# Patient Record
Sex: Male | Born: 1985 | Race: Black or African American | Hispanic: No | Marital: Single | Smoking: Current some day smoker
Health system: Southern US, Community
[De-identification: ages and names within clinical notes are randomized; demographics above are authoritative.]

## PROBLEM LIST (undated history)

## (undated) ENCOUNTER — Emergency Department (HOSPITAL_COMMUNITY): Discharge status: Against Medical Advice | Payer: Self-pay

## (undated) DIAGNOSIS — S62211A Bennett's fracture, right hand, initial encounter for closed fracture: Secondary | ICD-10-CM

## (undated) DIAGNOSIS — F209 Schizophrenia, unspecified: Secondary | ICD-10-CM

## (undated) HISTORY — PX: NO PAST SURGERIES: SHX2092

---

## 2008-01-02 ENCOUNTER — Emergency Department (HOSPITAL_COMMUNITY): Admission: EM | Admit: 2008-01-02 | Discharge: 2008-01-02 | Payer: Self-pay | Admitting: Emergency Medicine

## 2008-02-16 ENCOUNTER — Emergency Department (HOSPITAL_COMMUNITY): Admission: EM | Admit: 2008-02-16 | Discharge: 2008-02-16 | Payer: Self-pay | Admitting: Family Medicine

## 2010-01-22 ENCOUNTER — Ambulatory Visit: Payer: Self-pay | Admitting: Nurse Practitioner

## 2010-01-22 DIAGNOSIS — H0469 Other changes of lacrimal passages: Secondary | ICD-10-CM | POA: Insufficient documentation

## 2010-01-22 DIAGNOSIS — K029 Dental caries, unspecified: Secondary | ICD-10-CM | POA: Insufficient documentation

## 2010-01-22 DIAGNOSIS — F17200 Nicotine dependence, unspecified, uncomplicated: Secondary | ICD-10-CM | POA: Insufficient documentation

## 2010-01-22 LAB — CONVERTED CEMR LAB
AST: 16 units/L (ref 0–37)
Albumin: 4.4 g/dL (ref 3.5–5.2)
Alkaline Phosphatase: 44 units/L (ref 39–117)
Basophils Absolute: 0 10*3/uL (ref 0.0–0.1)
Basophils Relative: 1 % (ref 0–1)
Eosinophils Absolute: 0.1 10*3/uL (ref 0.0–0.7)
Eosinophils Relative: 1 % (ref 0–5)
Glucose, Bld: 89 mg/dL (ref 70–99)
HCT: 42.9 % (ref 39.0–52.0)
HDL: 65 mg/dL (ref >39)
LDL Cholesterol: 68 mg/dL (ref 0–99)
Lymphs Abs: 1.9 10*3/uL (ref 0.7–4.0)
MCHC: 32.6 g/dL (ref 30.0–36.0)
MCV: 95.8 fL (ref 78.0–100.0)
Neutrophils Relative %: 34 % — ABNORMAL LOW (ref 43–77)
Platelets: 272 10*3/uL (ref 150–400)
Potassium: 4.5 meq/L (ref 3.5–5.3)
RDW: 13.3 % (ref 11.5–15.5)
Sodium: 137 meq/L (ref 135–145)
TSH: 1.305 microintl units/mL (ref 0.350–4.500)
Total Bilirubin: 0.7 mg/dL (ref 0.3–1.2)
Total Protein: 7.3 g/dL (ref 6.0–8.3)
Triglycerides: 51 mg/dL (ref <150)
VLDL: 10 mg/dL (ref 0–40)
WBC: 4 10*3/uL (ref 4.0–10.5)

## 2010-01-23 ENCOUNTER — Encounter (INDEPENDENT_AMBULATORY_CARE_PROVIDER_SITE_OTHER): Payer: Self-pay | Admitting: Nurse Practitioner

## 2010-01-27 ENCOUNTER — Emergency Department (HOSPITAL_COMMUNITY): Admission: EM | Admit: 2010-01-27 | Discharge: 2010-01-27 | Payer: Self-pay | Admitting: Emergency Medicine

## 2010-02-05 ENCOUNTER — Encounter (INDEPENDENT_AMBULATORY_CARE_PROVIDER_SITE_OTHER): Payer: Self-pay | Admitting: Nurse Practitioner

## 2010-05-15 NOTE — Letter (Signed)
Summary: DENTAL REFERRAL  DENTAL REFERRAL   Imported By: Arta Bruce 02/09/2010 11:39:58  _____________________________________________________________________  External Attachment:    Type:   Image     Comment:   External Document

## 2010-05-15 NOTE — Assessment & Plan Note (Signed)
Summary: NEW - Establish Care   Vital Signs:  Patient profile:   25 year old male Height:      67 inches Weight:      149.7 pounds BMI:     23.53 Temp:     97.1 degrees F oral Pulse rate:   84 / minute Pulse rhythm:   regular Resp:     20 per minute BP sitting:   110 / 70  (left arm) Cuff size:   regular  Vitals Entered By: Levon Hedger (January 22, 2010 11:39 AM) 06/235CC: new establish...needs CPE...dental needs...facial concerns around eye Is Patient Diabetic? No Pain Assessment Patient in pain? no       Does patient need assistance? Functional Status Self care Ambulation Normal   CC:  new establish...needs CPE...dental needs...facial concerns around eye.  History of Present Illness:  Pt into the office to establish care Incarcerated for the past 6 years with recent release. No previous PCP  No PMH NO PSH    Habits & Providers  Alcohol-Tobacco-Diet     Alcohol drinks/day: <1     Alcohol Counseling: to decrease amount and/or frequency of alcohol intake     Tobacco Status: current     Tobacco Counseling: to quit use of tobacco products     Cigarette Packs/Day: <0.25     Year Started: 12  Exercise-Depression-Behavior     Does Patient Exercise: no     Exercise Counseling: to improve exercise regimen     Have you felt down or hopeless? no     Have you felt little pleasure in things? no     Depression Counseling: not indicated; screening negative for depression     Drug Use: marijuanna  Medications Prior to Update: 1)  None  Allergies (verified): No Known Drug Allergies  Family History: mother - healthy Father - noncontributory  Social History: incarcerated for 6 years, out in 2011 tobacco - Drug -  ETOH - Smoking Status:  current Drug Use:  marijuanna Packs/Day:  <0.25 Does Patient Exercise:  no  Review of Systems Eyes:  Complains of light sensitivity; denies red eye; left eye - intermittent swelling. no pain, no redness, no  itching. CV:  Denies chest pain or discomfort. Resp:  Denies cough. GI:  Denies abdominal pain, nausea, and vomiting.  Physical Exam  General:  alert.   Head:  normocephalic.   Eyes:  left upper lid - mobile, circumscribed nontender nodule left lower lid - mobile, circumscribed, nontender nodue x 2 Mouth:  fair dentition.   Lungs:  normal breath sounds.   Heart:  normal rate and regular rhythm.   Abdomen:  normal bowel sounds.   Msk:  normal ROM.   Neurologic:  alert & oriented X3.     Impression & Recommendations:  Problem # 1:  DENTAL CARIES (ICD-521.00) will refer to dental clinic  advised pt that dental clinic will not address cosmetic needs Orders: Dental Referral (Dentist)  Problem # 2:  OTHER CHANGE OF LACRIMAL PASSAGES (ICD-375.69) advised pt to apply warm compresses to area blocked tear ducts  Problem # 3:  TOBACCO ABUSE (ICD-305.1) advised cessation  Problem # 4:  HEALTH MAINTENANCE EXAM (ICD-V70.0) Declines flu vaccine today Orders: T-Comprehensive Metabolic Panel (40981-19147) T-Lipid Profile (82956-21308) T-CBC w/Diff (65784-69629) Rapid HIV  (52841) T-TSH (32440-10272)  Patient Instructions: 1)  Schedule an appointment for a complete physical exam. 2)  Will review labs. 3)  Will need u/a, testicular exam, tdap. 4)  Left eye - likely blocked  ducts in your eye. You may notice an increase in sensitivity in your eye meaning that it may be more sensitive to light and tear easily. 5)  Apply warm compresses to the left eye. 6)  You have declined the flu vaccine today.  just know that it available in this office and if you change your mind let us know. 7)  You will be referred to the dental clinic  Prevention & Chronic Care Immunizations   Influenza vaccine: Not documented   Influenza vaccine deferral: Refused  (01/22/2010)    Tetanus booster: Not documented    Pneumococcal vaccine: Not documented  Other Screening   Smoking status: current   (01/22/2010)   Appended Document: NEW - Establish Care  Laboratory Results    Other Tests  Rapid HIV: negative

## 2010-05-15 NOTE — Letter (Signed)
Summary: *HSN Results Follow up  [facility]  [address] [place], [place] [address]   Phone: [phone]  Fax: [phone]      01/23/2010   [facility] [address] [place], [place]  [address]   Dear  [name],                            ____S.Drinkard,FNP   ____D. Gore,FNP       ____B. McPherson,MD   ____V. Rankins,MD    ____E. Mulberry,MD    _X___N. Daphine Deutscher, FNP  ____D. Reche Dixon, MD    ____K. Philipp Deputy, MD    ____Other     This letter is to inform you that your recent test(s):  _______Pap Smear    ___X____Lab Test     _______X-ray    ___X____ is within acceptable limits  _______ requires a medication change  _______ requires a follow-up lab visit  _______ requires a follow-up visit with your provider   Comments: Labs done during recent office visit are normal.       _________________________________________________________ If you have any questions, please contact our office [phone].                    Sincerely,    Lehman Prom FNP [facility]

## 2010-05-15 NOTE — Letter (Signed)
Summary: PT INFORMATION SHEET  PT INFORMATION SHEET   Imported By: Arta Bruce 01/22/2010 14:28:06  _____________________________________________________________________  External Attachment:    Type:   Image     Comment:   External Document

## 2010-05-16 ENCOUNTER — Ambulatory Visit: Admit: 2010-05-16 | Payer: Self-pay | Admitting: Nurse Practitioner

## 2011-01-14 LAB — POCT I-STAT, CHEM 8
Creatinine, Ser: 1
Glucose, Bld: 89
HCT: 43
Hemoglobin: 14.6
Sodium: 141
TCO2: 21

## 2012-08-19 ENCOUNTER — Emergency Department (HOSPITAL_COMMUNITY): Payer: Self-pay

## 2012-08-19 ENCOUNTER — Emergency Department (HOSPITAL_COMMUNITY)
Admission: EM | Admit: 2012-08-19 | Discharge: 2012-08-19 | Payer: Self-pay | Attending: Emergency Medicine | Admitting: Emergency Medicine

## 2012-08-19 ENCOUNTER — Encounter (HOSPITAL_COMMUNITY): Payer: Self-pay | Admitting: Emergency Medicine

## 2012-08-19 DIAGNOSIS — Y939 Activity, unspecified: Secondary | ICD-10-CM | POA: Insufficient documentation

## 2012-08-19 DIAGNOSIS — S0181XA Laceration without foreign body of other part of head, initial encounter: Secondary | ICD-10-CM

## 2012-08-19 DIAGNOSIS — S0180XA Unspecified open wound of other part of head, initial encounter: Secondary | ICD-10-CM

## 2012-08-19 DIAGNOSIS — S0183XA Puncture wound without foreign body of other part of head, initial encounter: Secondary | ICD-10-CM

## 2012-08-19 DIAGNOSIS — W3400XA Accidental discharge from unspecified firearms or gun, initial encounter: Secondary | ICD-10-CM | POA: Insufficient documentation

## 2012-08-19 DIAGNOSIS — Y92410 Unspecified street and highway as the place of occurrence of the external cause: Secondary | ICD-10-CM | POA: Insufficient documentation

## 2012-08-19 LAB — COMPREHENSIVE METABOLIC PANEL
ALT: 51 U/L (ref 0–53)
AST: 82 U/L — ABNORMAL HIGH (ref 0–37)
Alkaline Phosphatase: 63 U/L (ref 39–117)
CO2: 20 mEq/L (ref 19–32)
Calcium: 9.2 mg/dL (ref 8.4–10.5)
GFR calc Af Amer: >90 mL/min (ref >90)
Glucose, Bld: 70 mg/dL (ref 70–99)
Potassium: 3.5 mEq/L (ref 3.5–5.1)
Sodium: 137 mEq/L (ref 135–145)
Total Protein: 8.5 g/dL — ABNORMAL HIGH (ref 6.0–8.3)

## 2012-08-19 LAB — CBC
HCT: 38 % — ABNORMAL LOW (ref 39.0–52.0)
Hemoglobin: 13.6 g/dL (ref 13.0–17.0)
MCH: 32.5 pg (ref 26.0–34.0)
MCHC: 35.8 g/dL (ref 30.0–36.0)
RDW: 12.3 % (ref 11.5–15.5)

## 2012-08-19 LAB — POCT I-STAT, CHEM 8
Chloride: 105 mEq/L (ref 96–112)
Creatinine, Ser: 1.3 mg/dL (ref 0.50–1.35)
Glucose, Bld: 74 mg/dL (ref 70–99)
HCT: 43 % (ref 39.0–52.0)
Hemoglobin: 14.6 g/dL (ref 13.0–17.0)
Potassium: 3.6 mEq/L (ref 3.5–5.1)
Sodium: 139 mEq/L (ref 135–145)

## 2012-08-19 LAB — CDS SEROLOGY

## 2012-08-19 MED ORDER — CHLORHEXIDINE GLUCONATE 0.12 % MT SOLN: 15.0000 mL | Freq: Two times a day (BID) | OROMUCOSAL | Status: DC

## 2012-08-19 MED ORDER — CEFAZOLIN SODIUM 1-5 GM-% IV SOLN: 1.0000 g | Freq: Once | INTRAVENOUS | Status: DC

## 2012-08-19 MED ORDER — HYDROMORPHONE HCL PF 1 MG/ML IJ SOLN
1.0000 mg | Freq: Once | INTRAMUSCULAR | Status: AC
  Administered 2012-08-19: 1 mg via INTRAVENOUS
  Filled 2012-08-19: qty 1

## 2012-08-19 MED ORDER — AMOXICILLIN-POT CLAVULANATE 875-125 MG PO TABS: 1.0000 | ORAL_TABLET | Freq: Two times a day (BID) | ORAL | Status: DC

## 2012-08-19 MED ORDER — IBUPROFEN 600 MG PO TABS: 600.0000 mg | ORAL_TABLET | Freq: Four times a day (QID) | ORAL | Status: DC | PRN

## 2012-08-19 MED ORDER — CEFAZOLIN SODIUM-DEXTROSE 2-3 GM-% IV SOLR
2.0000 g | Freq: Once | INTRAVENOUS | Status: AC
  Administered 2012-08-19: 2 g via INTRAVENOUS
  Filled 2012-08-19: qty 50

## 2012-08-19 MED ORDER — FENTANYL CITRATE 0.05 MG/ML IJ SOLN
INTRAMUSCULAR | Status: AC
  Filled 2012-08-19: qty 2

## 2012-08-19 MED ORDER — LIDOCAINE-EPINEPHRINE 2 %-1:100000 IJ SOLN
20.0000 mL | Freq: Once | INTRAMUSCULAR | Status: DC
  Filled 2012-08-19: qty 20

## 2012-08-19 MED ORDER — TETANUS-DIPHTH-ACELL PERTUSSIS 5-2.5-18.5 LF-MCG/0.5 IM SUSP
0.5000 mL | Freq: Once | INTRAMUSCULAR | Status: AC
  Administered 2012-08-19: 0.5 mL via INTRAMUSCULAR
  Filled 2012-08-19: qty 0.5

## 2012-08-19 MED ORDER — FENTANYL CITRATE 0.05 MG/ML IJ SOLN
50.0000 ug | INTRAMUSCULAR | Status: DC | PRN
  Administered 2012-08-19: 100 ug via INTRAVENOUS
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
## 2012-08-19 NOTE — ED Notes (Signed)
Patient transported to CT 
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
## 2012-08-19 NOTE — ED Notes (Signed)
BIB [facility].

## 2012-08-20 ENCOUNTER — Encounter (HOSPITAL_COMMUNITY): Payer: Self-pay | Admitting: Emergency Medicine

## 2012-08-20 LAB — TYPE AND SCREEN: ABO/RH(D): B NEG
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
## 2014-09-16 DIAGNOSIS — S62211A Bennett's fracture, right hand, initial encounter for closed fracture: Secondary | ICD-10-CM

## 2014-09-16 HISTORY — DX: Bennett's fracture, right hand, initial encounter for closed fracture: S62.211A

## 2014-09-20 ENCOUNTER — Encounter (HOSPITAL_COMMUNITY): Payer: Self-pay | Admitting: Emergency Medicine

## 2014-09-20 ENCOUNTER — Emergency Department (HOSPITAL_COMMUNITY)
Admission: EM | Admit: 2014-09-20 | Discharge: 2014-09-20 | Disposition: A | Discharge status: Home / Self Care | Payer: Self-pay | Attending: Emergency Medicine | Admitting: Emergency Medicine

## 2014-09-20 ENCOUNTER — Emergency Department (HOSPITAL_COMMUNITY): Payer: Self-pay

## 2014-09-20 DIAGNOSIS — Y998 Other external cause status: Secondary | ICD-10-CM | POA: Insufficient documentation

## 2014-09-20 DIAGNOSIS — W1839XA Other fall on same level, initial encounter: Secondary | ICD-10-CM | POA: Insufficient documentation

## 2014-09-20 DIAGNOSIS — Y9361 Activity, american tackle football: Secondary | ICD-10-CM | POA: Insufficient documentation

## 2014-09-20 DIAGNOSIS — S62231A Other displaced fracture of base of first metacarpal bone, right hand, initial encounter for closed fracture: Secondary | ICD-10-CM | POA: Insufficient documentation

## 2014-09-20 DIAGNOSIS — Y92321 Football field as the place of occurrence of the external cause: Secondary | ICD-10-CM | POA: Insufficient documentation

## 2014-09-20 DIAGNOSIS — S62309A Unspecified fracture of unspecified metacarpal bone, initial encounter for closed fracture: Secondary | ICD-10-CM

## 2014-09-20 NOTE — Discharge Instructions (Signed)

## 2014-09-20 NOTE — ED Provider Notes (Signed)
CSN: [id number]     Arrival date & time 09/20/14  1258 History  This chart was scribed for a non-physician practitioner, Eyvonne Mechanic, PA-C working with Rolland Porter, MD by [place] Peace, ED Scribe. The patient was seen in [facility]. The patient's care was started at 2:21 PM.     Chief Complaint  Patient presents with  . Hand Pain      Patient is a 29 y.o. male presenting with hand pain. The history is provided by the patient. No language interpreter was used.  Hand Pain  HPI Comments: Mike Thompson is a 29 y.o. male who presents to the Emergency Department complaining of progressively worsening radiating right hand pain onset Friday that extends up into his elbow. incident occurred while he was playing football and fell on affected hand. Unknown mechanism of injury, he states he can't remember how exactly how landed on injured hand. He notes that he has tried wrapping affected hand and taking ibuprofen to address pain without relief. Pt is right-hand dominant. Last food intake was a banana around 10:00 AM. Pt is current drinker (last drink- 2 days ago). Pt is non-smoker. He states he is not currently employed. No history of trauma to the hand previously.   History reviewed. No pertinent past medical history. History reviewed. No pertinent past surgical history. History reviewed. No pertinent family history. History  Substance Use Topics  . Smoking status: Never Smoker   . Smokeless tobacco: Not on file  . Alcohol Use: No    Review of Systems  Musculoskeletal: Positive for arthralgias.       Right hand pain and right elbow pain.   All other systems reviewed and are negative.   Allergies  Review of patient's allergies indicates no known allergies.  Home Medications   Prior to Admission medications   Medication Sig Start Date End Date Taking? Authorizing Provider  amoxicillin-clavulanate (AUGMENTIN) 875-125 MG per tablet Take 1 tablet by mouth 2 (two) times daily. One po bid x  7 days 08/19/12   Oleh Genin, MD  chlorhexidine (PERIDEX) 0.12 % solution Use as directed 15 mLs in the mouth or throat 2 (two) times daily. Swish and spit. 08/19/12   Oleh Genin, MD  ibuprofen (ADVIL,MOTRIN) 600 MG tablet Take 1 tablet (600 mg total) by mouth every 6 (six) hours as needed for pain. 08/19/12   Oleh Genin, MD   Pulse 66  Temp(Src) 98.1 F (36.7 C) (Oral)  Resp 18  SpO2 100%  Physical Exam  Constitutional: He is oriented to person, place, and time. He appears well-developed and well-nourished. No distress.  HENT:  Head: Normocephalic and atraumatic.  Eyes: Conjunctivae and EOM are normal.  Neck: Neck supple. No tracheal deviation present.  Cardiovascular: Normal rate.   Pulmonary/Chest: Effort normal. No respiratory distress.  Musculoskeletal: He exhibits tenderness.  ROM of 2-4 intact. Decreased ROM of thumb. Pain with flexion, extension, and ulnar deviation.   Neurological: He is alert and oriented to person, place, and time.  Skin: Skin is warm and dry.  Psychiatric: He has a normal mood and affect. His behavior is normal.  Nursing note and vitals reviewed.   ED Course  Procedures (including critical care time) Labs Review Labs Reviewed - No data to display  Imaging Review Dg Hand Complete Right  09/20/2014   CLINICAL DATA:  Hand pain. Football injury on Friday. Pain at the base of the thumb.  EXAM: RIGHT HAND - COMPLETE 3+ VIEW  COMPARISON:  None.  FINDINGS: There  is lateral subluxation of the thumb on the trapezium. There is a fracture at the ulnar base of the first metacarpal with intra-articular extension. There appears to be a single fragment from the base of the metacarpal, most compatible with a Bennett fracture. Otherwise the hand appears within normal limits.  IMPRESSION: RIGHT first metacarpal base intra-articular Bennett fracture.   Electronically Signed   By: Andreas NewportGeoffrey  Lamke M.D.   On: 09/20/2014 14:01     EKG Interpretation None     Medications -  No data to display  2:24 PM- Treatment plan was discussed with patient who verbalizes understanding and agrees.   MDM   Final diagnoses:  Metacarpal bone fracture, closed, initial encounter      Imaging: DG hand complete right first metacarpal base intra-articular Bennett fracture subluxation   Consults: hand surgery   Therapeutics:  None  Assessment: Metacarpal fracture  Plan: Patient presents with a metacarpal flexure and subluxation he was in minimal pain, no concerning findings including sensory or strength deficits, was placed into a splint, and given follow-up information for hand surgeon. Patient verbalizes understanding and agreement for today's plan and had no further questions or concerns at time of discharge. He reports that he would follow-up. Patient was given strict return precautions.    I personally performed the services described in this documentation, which was scribed in my presence. The recorded information has been reviewed and is accurate.   Eyvonne MechanicJeffrey Yalonda Sample, PA-C 09/21/14 [id number]  Rolland PorterMark James, MD 09/30/14 [phone]

## 2014-09-20 NOTE — ED Notes (Signed)
Pt states that he was playing football on Friday and injured his right thumb/hand.  States that it feels like "a bone is popping out".

## 2014-09-23 ENCOUNTER — Encounter (HOSPITAL_BASED_OUTPATIENT_CLINIC_OR_DEPARTMENT_OTHER): Payer: Self-pay | Admitting: *Deleted

## 2014-09-23 ENCOUNTER — Other Ambulatory Visit: Payer: Self-pay | Admitting: Orthopedic Surgery

## 2014-09-26 ENCOUNTER — Ambulatory Visit (HOSPITAL_BASED_OUTPATIENT_CLINIC_OR_DEPARTMENT_OTHER): Payer: Self-pay | Admitting: Certified Registered"

## 2014-09-26 ENCOUNTER — Ambulatory Visit (HOSPITAL_BASED_OUTPATIENT_CLINIC_OR_DEPARTMENT_OTHER)
Admission: RE | Admit: 2014-09-26 | Discharge: 2014-09-26 | Disposition: A | Discharge status: Home / Self Care | Payer: Self-pay | Source: Ambulatory Visit | Attending: Orthopedic Surgery | Admitting: Orthopedic Surgery

## 2014-09-26 ENCOUNTER — Encounter (HOSPITAL_BASED_OUTPATIENT_CLINIC_OR_DEPARTMENT_OTHER)
Admission: RE | Disposition: A | Discharge status: Home / Self Care | Payer: Self-pay | Source: Ambulatory Visit | Attending: Orthopedic Surgery

## 2014-09-26 ENCOUNTER — Encounter (HOSPITAL_BASED_OUTPATIENT_CLINIC_OR_DEPARTMENT_OTHER): Payer: Self-pay | Admitting: Anesthesiology

## 2014-09-26 DIAGNOSIS — Y929 Unspecified place or not applicable: Secondary | ICD-10-CM | POA: Insufficient documentation

## 2014-09-26 DIAGNOSIS — X58XXXA Exposure to other specified factors, initial encounter: Secondary | ICD-10-CM | POA: Insufficient documentation

## 2014-09-26 DIAGNOSIS — S62211A Bennett's fracture, right hand, initial encounter for closed fracture: Secondary | ICD-10-CM | POA: Insufficient documentation

## 2014-09-26 DIAGNOSIS — Y9361 Activity, american tackle football: Secondary | ICD-10-CM | POA: Insufficient documentation

## 2014-09-26 DIAGNOSIS — F17210 Nicotine dependence, cigarettes, uncomplicated: Secondary | ICD-10-CM | POA: Insufficient documentation

## 2014-09-26 DIAGNOSIS — Z791 Long term (current) use of non-steroidal anti-inflammatories (NSAID): Secondary | ICD-10-CM | POA: Insufficient documentation

## 2014-09-26 DIAGNOSIS — Y999 Unspecified external cause status: Secondary | ICD-10-CM | POA: Insufficient documentation

## 2014-09-26 HISTORY — PX: CLOSED REDUCTION FINGER WITH PERCUTANEOUS PINNING: SHX5612

## 2014-09-26 HISTORY — DX: Bennett's fracture, right hand, initial encounter for closed fracture: S62.211A

## 2014-09-26 LAB — POCT HEMOGLOBIN-HEMACUE: HEMOGLOBIN: 14.4 g/dL (ref 13.0–17.0)

## 2014-09-26 SURGERY — CLOSED REDUCTION, FINGER, WITH PERCUTANEOUS PINNING
Anesthesia: Regional | Site: Thumb | Laterality: Right

## 2014-09-26 MED ORDER — CEFAZOLIN SODIUM-DEXTROSE 2-3 GM-% IV SOLR
2.0000 g | INTRAVENOUS | Status: AC
  Administered 2014-09-26: 2 g via INTRAVENOUS

## 2014-09-26 MED ORDER — CEFAZOLIN SODIUM-DEXTROSE 2-3 GM-% IV SOLR
INTRAVENOUS | Status: AC
  Filled 2014-09-26: qty 50

## 2014-09-26 MED ORDER — DEXAMETHASONE SODIUM PHOSPHATE 4 MG/ML IJ SOLN
INTRAMUSCULAR | Status: DC | PRN
  Administered 2014-09-26: 10 mg via INTRAVENOUS

## 2014-09-26 MED ORDER — SCOPOLAMINE 1 MG/3DAYS TD PT72: 1.0000 | MEDICATED_PATCH | Freq: Once | TRANSDERMAL | Status: DC | PRN

## 2014-09-26 MED ORDER — LIDOCAINE-EPINEPHRINE (PF) 1.5 %-1:200000 IJ SOLN
INTRAMUSCULAR | Status: DC | PRN
  Administered 2014-09-26: 25 mL via PERINEURAL

## 2014-09-26 MED ORDER — MIDAZOLAM HCL 2 MG/2ML IJ SOLN
1.0000 mg | INTRAMUSCULAR | Status: DC | PRN
  Administered 2014-09-26: 1 mg via INTRAVENOUS

## 2014-09-26 MED ORDER — FENTANYL CITRATE (PF) 100 MCG/2ML IJ SOLN
INTRAMUSCULAR | Status: AC
  Filled 2014-09-26: qty 6

## 2014-09-26 MED ORDER — MIDAZOLAM HCL 2 MG/2ML IJ SOLN
INTRAMUSCULAR | Status: AC
  Filled 2014-09-26: qty 2

## 2014-09-26 MED ORDER — ACETAMINOPHEN 500 MG PO TABS: 1000.0000 mg | ORAL_TABLET | Freq: Once | ORAL | Status: DC

## 2014-09-26 MED ORDER — LACTATED RINGERS IV SOLN: 500.0000 mL | INTRAVENOUS | Status: DC

## 2014-09-26 MED ORDER — MEPERIDINE HCL 25 MG/ML IJ SOLN: 6.2500 mg | INTRAMUSCULAR | Status: DC | PRN

## 2014-09-26 MED ORDER — PROPOFOL INFUSION 10 MG/ML OPTIME
INTRAVENOUS | Status: DC | PRN
  Administered 2014-09-26: 100 ug/kg/min via INTRAVENOUS

## 2014-09-26 MED ORDER — HYDROMORPHONE HCL 1 MG/ML IJ SOLN: 0.2500 mg | INTRAMUSCULAR | Status: DC | PRN

## 2014-09-26 MED ORDER — BUPIVACAINE-EPINEPHRINE (PF) 0.5% -1:200000 IJ SOLN
INTRAMUSCULAR | Status: DC | PRN
  Administered 2014-09-26: 30 mL via PERINEURAL

## 2014-09-26 MED ORDER — FENTANYL CITRATE (PF) 100 MCG/2ML IJ SOLN
INTRAMUSCULAR | Status: AC
  Filled 2014-09-26: qty 2

## 2014-09-26 MED ORDER — CHLORHEXIDINE GLUCONATE 4 % EX LIQD: 60.0000 mL | Freq: Once | CUTANEOUS | Status: DC

## 2014-09-26 MED ORDER — LACTATED RINGERS IV SOLN
INTRAVENOUS | Status: DC
  Administered 2014-09-26: 12:00:00 via INTRAVENOUS

## 2014-09-26 MED ORDER — ACETAMINOPHEN 160 MG/5ML PO SOLN: 960.0000 mg | Freq: Once | ORAL | Status: DC

## 2014-09-26 MED ORDER — OXYCODONE-ACETAMINOPHEN 5-325 MG PO TABS: ORAL_TABLET | ORAL | Status: DC

## 2014-09-26 MED ORDER — ONDANSETRON HCL 4 MG/2ML IJ SOLN
INTRAMUSCULAR | Status: DC | PRN
  Administered 2014-09-26: 4 mg via INTRAVENOUS

## 2014-09-26 MED ORDER — PROMETHAZINE HCL 25 MG/ML IJ SOLN: 6.2500 mg | INTRAMUSCULAR | Status: DC | PRN

## 2014-09-26 MED ORDER — MIDAZOLAM HCL 2 MG/2ML IJ SOLN: 0.5000 mg | Freq: Once | INTRAMUSCULAR | Status: DC | PRN

## 2014-09-26 MED ORDER — FENTANYL CITRATE (PF) 100 MCG/2ML IJ SOLN
INTRAMUSCULAR | Status: DC | PRN
  Administered 2014-09-26: 50 ug via INTRAVENOUS

## 2014-09-26 MED ORDER — LIDOCAINE HCL (CARDIAC) 20 MG/ML IV SOLN
INTRAVENOUS | Status: DC | PRN
  Administered 2014-09-26: 50 mg via INTRAVENOUS

## 2014-09-26 MED ORDER — FENTANYL CITRATE (PF) 100 MCG/2ML IJ SOLN
50.0000 ug | Freq: Once | INTRAMUSCULAR | Status: AC
  Administered 2014-09-26: 50 ug via INTRAVENOUS

## 2014-09-26 MED ORDER — BUPIVACAINE HCL (PF) 0.25 % IJ SOLN
INTRAMUSCULAR | Status: AC
Start: 2014-09-26 — End: 2014-09-26
  Filled 2014-09-26: qty 30

## 2014-09-26 SURGICAL SUPPLY — 48 items
BANDAGE ELASTIC 3 VELCRO ST LF (GAUZE/BANDAGES/DRESSINGS) ×3 IMPLANT
BLADE SURG 15 STRL LF DISP TIS (BLADE) ×2 IMPLANT
BLADE SURG 15 STRL SS (BLADE) ×4
BNDG ELASTIC 2 VLCR STRL LF (GAUZE/BANDAGES/DRESSINGS) IMPLANT
BNDG ESMARK 4X9 LF (GAUZE/BANDAGES/DRESSINGS) ×3 IMPLANT
BNDG GAUZE ELAST 4 BULKY (GAUZE/BANDAGES/DRESSINGS) ×3 IMPLANT
CHLORAPREP W/TINT 26ML (MISCELLANEOUS) ×3 IMPLANT
CORDS BIPOLAR (ELECTRODE) IMPLANT
COVER BACK TABLE 60X90IN (DRAPES) ×3 IMPLANT
COVER MAYO STAND STRL (DRAPES) ×3 IMPLANT
CUFF TOURNIQUET SINGLE 18IN (TOURNIQUET CUFF) ×3 IMPLANT
DRAPE EXTREMITY T 121X128X90 (DRAPE) ×3 IMPLANT
DRAPE OEC MINIVIEW 54X84 (DRAPES) ×3 IMPLANT
DRAPE SURG 17X23 STRL (DRAPES) ×3 IMPLANT
GAUZE SPONGE 4X4 12PLY STRL (GAUZE/BANDAGES/DRESSINGS) ×3 IMPLANT
GAUZE XEROFORM 1X8 LF (GAUZE/BANDAGES/DRESSINGS) ×3 IMPLANT
GLOVE BIO SURGEON STRL SZ7.5 (GLOVE) ×3 IMPLANT
GLOVE BIOGEL PI IND STRL 7.0 (GLOVE) ×1 IMPLANT
GLOVE BIOGEL PI IND STRL 8 (GLOVE) ×1 IMPLANT
GLOVE BIOGEL PI IND STRL 8.5 (GLOVE) IMPLANT
GLOVE BIOGEL PI INDICATOR 7.0 (GLOVE) ×2
GLOVE BIOGEL PI INDICATOR 8 (GLOVE) ×2
GLOVE BIOGEL PI INDICATOR 8.5 (GLOVE)
GLOVE ECLIPSE 6.5 STRL STRAW (GLOVE) ×3 IMPLANT
GLOVE EXAM NITRILE EXT CUFF MD (GLOVE) ×3 IMPLANT
GLOVE SURG ORTHO 8.0 STRL STRW (GLOVE) IMPLANT
GOWN STRL REUS W/ TWL LRG LVL3 (GOWN DISPOSABLE) ×1 IMPLANT
GOWN STRL REUS W/TWL LRG LVL3 (GOWN DISPOSABLE) ×2
K-WIRE .045X4 (WIRE) ×3 IMPLANT
K-WIRE .062X4 (WIRE) ×3 IMPLANT
NEEDLE HYPO 25X1 1.5 SAFETY (NEEDLE) IMPLANT
NS IRRIG 1000ML POUR BTL (IV SOLUTION) IMPLANT
PACK BASIN DAY SURGERY FS (CUSTOM PROCEDURE TRAY) ×3 IMPLANT
PAD CAST 3X4 CTTN HI CHSV (CAST SUPPLIES) ×1 IMPLANT
PAD CAST 4YDX4 CTTN HI CHSV (CAST SUPPLIES) IMPLANT
PADDING CAST COTTON 3X4 STRL (CAST SUPPLIES) ×2
PADDING CAST COTTON 4X4 STRL (CAST SUPPLIES)
SLEEVE SCD COMPRESS KNEE MED (MISCELLANEOUS) IMPLANT
SLING ARM LRG ADULT FOAM STRAP (SOFTGOODS) ×3 IMPLANT
SPLINT PLASTER CAST XFAST 3X15 (CAST SUPPLIES) ×20 IMPLANT
SPLINT PLASTER XTRA FASTSET 3X (CAST SUPPLIES) ×40
STOCKINETTE 4X48 STRL (DRAPES) ×3 IMPLANT
SUT ETHILON 3 0 PS 1 (SUTURE) IMPLANT
SUT ETHILON 4 0 PS 2 18 (SUTURE) IMPLANT
SYR BULB 3OZ (MISCELLANEOUS) IMPLANT
SYR CONTROL 10ML LL (SYRINGE) IMPLANT
TOWEL OR 17X24 6PK STRL BLUE (TOWEL DISPOSABLE) ×6 IMPLANT
UNDERPAD 30X30 (UNDERPADS AND DIAPERS) ×3 IMPLANT

## 2014-09-26 NOTE — Discharge Instructions (Addendum)

## 2014-09-26 NOTE — Op Note (Signed)
Intra-operative fluoroscopic images in the AP, lateral, and oblique views were taken and evaluated by myself.  Reduction and hardware placement were confirmed.  There was no intraarticular penetration of permanent hardware.  

## 2014-09-26 NOTE — Progress Notes (Signed)
Assisted Dr. Jackson with right, axillary block. Side rails up, monitors on throughout procedure. See vital signs in flow sheet. Tolerated Procedure well. 

## 2014-09-26 NOTE — Op Note (Signed)
[id number] 

## 2014-09-26 NOTE — Brief Op Note (Signed)
09/26/2014  2:39 PM  PATIENT:  Mike Thompson  29 y.o. male  PRE-OPERATIVE DIAGNOSIS:  right thumb Bennett's Fracture  S62.211A  POST-OPERATIVE DIAGNOSIS:  right thumb Bennett's Fracture  S62.211A  PROCEDURE:  Procedure(s) with comments: CLOSED REDUCTION FINGER WITH PERCUTANEOUS PINNING RIGHT THUMB BENNETT'S FRACTURE (Right) - block with sedation  SURGEON:  Surgeon(s) and Role:    * Betha Loa, MD - Primary  PHYSICIAN ASSISTANT:   ASSISTANTS: none   ANESTHESIA:   regional and sedation  EBL:  Total I/O In: 700 [I.V.:700] Out: -   BLOOD ADMINISTERED:none  DRAINS: none   LOCAL MEDICATIONS USED:  NONE  SPECIMEN:  No Specimen  DISPOSITION OF SPECIMEN:  N/A  COUNTS:  YES  TOURNIQUET:   Total Tourniquet Time Documented: Upper Arm (Right) - 16 minutes Total: Upper Arm (Right) - 16 minutes   DICTATION: .Other Dictation: Dictation Number [id number]  PLAN OF CARE: Discharge to home after PACU  PATIENT DISPOSITION:  PACU - hemodynamically stable.

## 2014-09-26 NOTE — Transfer of Care (Signed)
Immediate Anesthesia Transfer of Care Note  Patient: Mike Thompson  Procedure(s) Performed: Procedure(s) with comments: CLOSED REDUCTION FINGER WITH PERCUTANEOUS PINNING RIGHT THUMB BENNETT'S FRACTURE (Right) - block with sedation  Patient Location: PACU  Anesthesia Type:MAC and Regional  Level of Consciousness: awake and alert   Airway & Oxygen Therapy: Patient Spontanous Breathing and Patient connected to face mask oxygen  Post-op Assessment: Report given to RN and Post -op Vital signs reviewed and stable  Post vital signs: Reviewed and stable  Last Vitals:  Filed Vitals:   09/26/14 1250  BP:   Pulse: 79  Temp:   Resp: 24    Complications: No apparent anesthesia complications

## 2014-09-26 NOTE — Anesthesia Procedure Notes (Signed)
Anesthesia Regional Block:  Axillary brachial plexus block  Pre-Anesthetic Checklist: ,, timeout performed, Correct Patient, Correct Site, Correct Laterality, Correct Procedure, Correct Position, site marked, Risks and benefits discussed,  Surgical consent,  Pre-op evaluation,  At surgeon's request and post-op pain management  Laterality: Right  Prep: chloraprep       Needles:  Injection technique: Single-shot  Needle Type: Other   (short "B" bevel)    Needle Gauge: 22 and 22 G    Additional Needles:  Procedures: paresthesia technique Axillary brachial plexus block  Nerve Stimulator or Paresthesia:  Response: transient median nerve paresthesia,  Response: transient ulnar nerve paresthesia,   Additional Responses:   Narrative:  Start time: 09/26/2014 12:44 PM End time: 09/26/2014 12:51 PM Injection made incrementally with aspirations every 5 mL.  Performed by: Personally  Anesthesiologist: Jean Rosenthal, Koleen Celia  Additional Notes: Pt identified in Holding room.  Monitors applied. Working IV access confirmed. Sterile prep R axilla.  #22ga short "B" bevel to transient median and radial nerve parethesiae.  Total 30cc 0.5% Bupivacaine with 1:200k epi and 25cc 1.5% Lidocaine with 1:200k epi injected incrementally, after negative test doses, distributed around each paresthesia.  Additional 5cc intercostobrachial skin wheal, subq, with 5cc 1.5% lidocaine with 1:200k epi. Patient asymptomatic, VSS, no heme aspirated, tolerated well.  Sandford Craze, MD

## 2014-09-26 NOTE — Anesthesia Preprocedure Evaluation (Addendum)
Anesthesia Evaluation  Patient identified by MRN, date of birth, ID band Patient awake    Reviewed: Allergy & Precautions, NPO status , Patient's Chart, lab work & pertinent test results  History of Anesthesia Complications Negative for: history of anesthetic complications  Airway Mallampati: I  TM Distance: >3 FB Neck ROM: Full    Dental  (+) Dental Advisory Given Bonded incisor:   Pulmonary Current Smoker,  breath sounds clear to auscultation        Cardiovascular negative cardio ROS  Rhythm:Regular Rate:Normal     Neuro/Psych negative neurological ROS     GI/Hepatic negative GI ROS, Neg liver ROS,   Endo/Other  negative endocrine ROS  Renal/GU negative Renal ROS     Musculoskeletal   Abdominal   Peds  Hematology negative hematology ROS (+)   Anesthesia Other Findings   Reproductive/Obstetrics                            Anesthesia Physical Anesthesia Plan  ASA: II  Anesthesia Plan: Regional   Post-op Pain Management:    Induction:   Airway Management Planned: Natural Airway and Simple Face Mask  Additional Equipment:   Intra-op Plan:   Post-operative Plan:   Informed Consent: I have reviewed the patients History and Physical, chart, labs and discussed the procedure including the risks, benefits and alternatives for the proposed anesthesia with the patient or authorized representative who has indicated his/her understanding and acceptance.   Dental advisory given  Plan Discussed with: CRNA and Surgeon  Anesthesia Plan Comments: (Plan routine monitors, Axillary block)        Anesthesia Quick Evaluation

## 2014-09-26 NOTE — Anesthesia Postprocedure Evaluation (Signed)
  Anesthesia Post-op Note  Patient: Mike Thompson  Procedure(s) Performed: Procedure(s) with comments: CLOSED REDUCTION FINGER WITH PERCUTANEOUS PINNING RIGHT THUMB BENNETT'S FRACTURE (Right) - block with sedation  Patient Location: PACU  Anesthesia Type:MAC and Regional  Level of Consciousness: awake, alert , oriented, patient cooperative and responds to stimulation  Airway and Oxygen Therapy: Patient Spontanous Breathing  Post-op Pain: none  Post-op Assessment: Post-op Vital signs reviewed, Patient's Cardiovascular Status Stable, Respiratory Function Stable, Patent Airway, No signs of Nausea or vomiting and Pain level controlled              Post-op Vital Signs: Reviewed and stable  Last Vitals:  Filed Vitals:   09/26/14 1515  BP: 120/85  Pulse: 60  Temp:   Resp: 19    Complications: No apparent anesthesia complications

## 2014-09-26 NOTE — H&P (Signed)
  Mike Thompson is an 29 y.o. male.   Chief Complaint: right thumb bennett's fracture  HPI: 29 yo rhd male states he injured right thumb playing football 09/16/14.  Seen at [facility] 09/20/14 where XR revealed fracture of thumb metacarpal.  Splinted and followed up in office.  Reports no previous injury to thumb.  Has some elbow pain.  Past Medical History  Diagnosis Date  . Bennett's fracture of base of metacarpal of right thumb 09/16/2014    Past Surgical History  Procedure Laterality Date  . No past surgeries      History reviewed. No pertinent family history. Social History:  reports that he has been smoking Cigarettes.  He has smoked for the past 5 years. He has never used smokeless tobacco. He reports that he drinks alcohol. He reports that he does not use illicit drugs.  Allergies: No Known Allergies  Medications Prior to Admission  Medication Sig Dispense Refill  . ibuprofen (ADVIL,MOTRIN) 600 MG tablet Take 1 tablet (600 mg total) by mouth every 6 (six) hours as needed for pain. 30 tablet 0    Results for orders placed or performed during the hospital encounter of 09/26/14 (from the past 48 hour(s))  Hemoglobin-hemacue, POC     Status: None   Collection Time: 09/26/14 12:19 PM  Result Value Ref Range   Hemoglobin 14.4 13.0 - 17.0 g/dL    No results found.   A comprehensive review of systems was negative.  Blood pressure 116/79, pulse 79, temperature 98.2 F (36.8 C), temperature source Oral, resp. rate 24, height 5\' 7"  (1.702 m), weight 63.504 kg (140 lb), SpO2 100 %.  General appearance: alert, cooperative and appears stated age Head: Normocephalic, without obvious abnormality, atraumatic Neck: supple, symmetrical, trachea midline Resp: clear to auscultation bilaterally Cardio: regular rate and rhythm GI: non tender Extremities: intact senstaion and capillary refill all digits.  +epl/fpl/io.  no wounds.  ttp base of right thumb metacarpal.   Pulses: 2+ and  symmetric Skin: Skin color, texture, turgor normal. No rashes or lesions Neurologic: Grossly normal Incision/Wound: none  Assessment/Plan Right thumb Bennett's fracture.  Non operative and operative treatment options were discussed with the patient and patient wishes to proceed with operative treatment. Risks, benefits, and alternatives of surgery were discussed and the patient agrees with the plan of care.   Ondrea Dow R 09/26/2014, 1:13 PM

## 2014-09-27 NOTE — Op Note (Signed)
[name], [name] NO.:  [id number]  MEDICAL RECORD NO.:  [id number]  LOCATION:                               FACILITY:  [facility]  PHYSICIAN:  Betha Loa, MD        DATE OF BIRTH:  1985/08/20  DATE OF PROCEDURE:  09/26/2014 DATE OF DISCHARGE:  09/26/2014                              OPERATIVE REPORT   PREOPERATIVE DIAGNOSIS:  Right thumb Bennett's fracture.  POSTOPERATIVE DIAGNOSIS:  Right thumb Bennett's fracture.  PROCEDURE:  Closed reduction and percutaneous pinning of right thumb Bennett's fracture.  SURGEON:  Betha Loa, MD  ASSISTANT:  None.  ANESTHESIA:  Regional with sedation.  IV FLUIDS:  Per anesthesia flow sheet.  ESTIMATED BLOOD LOSS:  Minimal.  COMPLICATIONS:  None.  SPECIMENS:  None.  TOURNIQUET TIME:  16 minutes.  DISPOSITION:  Stable to PACU.  INDICATIONS:  [name] is a 29 year old male, who states he injured his right thumb while playing football approximately 10 days ago.  He was seen at the emergency department where radiographs were taken revealing a fracture at the base of the thumb metacarpal.  He was referred to me for further care.  We discussed nonoperative and operative treatment options.  I recommended operative fixation.  Risks, benefits, and alternatives of the surgery were discussed including the risk of blood loss; infection; damage to nerves, vessels, tendons, ligaments, bone; failure of surgery; need for additional surgery; complications with wound healing; continued pain; nonunion; malunion; stiffness.  He voiced understanding of these risks and elected to proceed.  OPERATIVE COURSE:  After being identified preoperatively by myself, the patient and I agreed upon the procedure and site of procedure.  Surgical site was marked.  The risks, benefits, and alternatives of surgery were reviewed and he wished to proceed.  Surgical consent had been signed. He was given IV Ancef as preoperative antibiotic  prophylaxis.  He was transported to the operating room and placed in the operating room table in supine position with the right upper extremity on an armboard.  A regional block had been performed by Anesthesia in the preoperative holding.  Right upper extremity was prepped and draped in normal sterile orthopedic fashion.  A surgical pause was performed between the surgeons, anesthesia, and operating room staff, and all were in agreement as to the patient, procedure, and site of procedure. Tourniquet at the proximal aspect of the extremity was inflated to 250 mmHg after exsanguination of the limb with an Esmarch bandage.  C-arm was used in AP, lateral, and oblique projections throughout the case to aid in reduction and position of hardware.  The thumb metacarpal base fracture was reduced.  A 0.062-inch K-wire was advanced distal to the fracture across the [facility] joint of the thumb.  This was adequately stabilized to the base of the thumb.  An additional 0.045-inch K-wire was then advanced across the thumb metacarpal and into the index metacarpal to provide additional stabilization.  C-arm was used in AP, lateral, and oblique projections to ensure appropriate reduction and position of hardware, which was the case.  There was good reduction of the fragment.  The pins were bent and cut short and the pin sites were dressed with  sterile Xeroform, 4x4s, and wrapped with a Kerlix bandage. A thumb spica splint was placed and wrapped with Kerlix and Ace bandage. Tourniquet was deflated at 16 minutes.  Fingertips were pink with brisk capillary refill after deflation of the tourniquet.  Prior to starting the case, the elbow was examined and was stable to stressing the radial and ulnar collateral ligaments.  There was no swelling, erythema, ecchymosis and no wounds.  The patient was awakened from anesthesia safely.  He was transferred back to stretcher and taken to PACU in stable condition.  I will see  him back in the office in 1 week for postoperative followup.  I will give him Percocet 5/325, 1-2 p.o. q.6 hours p.r.n. pain, dispensed #30.     Betha Loa, MD   ______________________________ Betha Loa, MD    KK/MEDQ  D:  09/26/2014  T:  09/27/2014  Job:  [id number]

## 2014-09-28 ENCOUNTER — Encounter (HOSPITAL_BASED_OUTPATIENT_CLINIC_OR_DEPARTMENT_OTHER): Payer: Self-pay | Admitting: Orthopedic Surgery

## 2014-09-29 ENCOUNTER — Encounter: Payer: Self-pay | Admitting: Occupational Therapy

## 2014-09-29 ENCOUNTER — Ambulatory Visit: Payer: Self-pay | Attending: Orthopedic Surgery | Admitting: Occupational Therapy

## 2014-09-29 DIAGNOSIS — M79644 Pain in right finger(s): Secondary | ICD-10-CM | POA: Insufficient documentation

## 2014-09-29 DIAGNOSIS — M25641 Stiffness of right hand, not elsewhere classified: Secondary | ICD-10-CM | POA: Insufficient documentation

## 2014-09-29 NOTE — Patient Instructions (Signed)
SPLINT WEAR AND CARE   WEARING SCHEDULE:  Wear splint at ALL times except for hygiene care   PURPOSE:  To prevent movement and for protection until injury can heal  CARE OF SPLINT:  Keep splint away from heat sources including: stove, radiator or furnace, or a car in sunlight. The splint can melt and will no longer fit you properly  Keep away from pets and children  Clean the splint with rubbing alcohol 1-2 times per day. Clean around base of pins 2 times/day using Q-tips with hydrogen peroxide.  * During this time, make sure you also clean your hand/arm as instructed by your therapist and/or perform dressing changes as needed. Then dry hand/arm completely before replacing splint. (When cleaning hand/arm, keep it immobilized in same position until splint is replaced)  PRECAUTIONS/POTENTIAL PROBLEMS: *If you notice or experience increased pain, swelling, numbness, or a lingering reddened area from the splint: Contact your therapist immediately by calling [phone]. You must wear the splint for protection, but we will get you scheduled for adjustments as quickly as possible.  (If only straps or hooks need to be replaced and NO adjustments to the splint need to be made, just call the office ahead and let them know you are coming in)  If you have any medical concerns or signs of infection, please call your doctor immediately

## 2014-09-29 NOTE — Therapy (Signed)
[facility] [address] [place], [place], [address] Phone: [phone]   Fax:  [phone]  Occupational Therapy Evaluation  Patient Details  Name: Mike Thompson MRN: [id number] Date of Birth: 08-21-1985 Referring Provider:  Betha Loa, MD  Encounter Date: 09/29/2014      OT End of Session - 09/29/14 1427    Visit Number 1   Number of Visits 8   Date for OT Re-Evaluation 11/29/14   Authorization Type self pay   OT Start Time 1315   OT Stop Time 1415   OT Time Calculation (min) 60 min   Equipment Utilized During Treatment splint   Activity Tolerance Patient tolerated treatment well      Past Medical History  Diagnosis Date  . Bennett's fracture of base of metacarpal of right thumb 09/16/2014    Past Surgical History  Procedure Laterality Date  . No past surgeries    . Closed reduction finger with percutaneous pinning Right 09/26/2014    Procedure: CLOSED REDUCTION FINGER WITH PERCUTANEOUS PINNING RIGHT THUMB BENNETT'S FRACTURE;  Surgeon: Betha Loa, MD;  Location: [facility];  Service: Orthopedics;  Laterality: Right;  block with sedation    There were no vitals filed for this visit.  Visit Diagnosis:  Pain of right thumb - Plan: Ot plan of care cert/re-cert  Stiffness of joint, hand, right - Plan: Ot plan of care cert/re-cert      Subjective Assessment - 09/29/14 1317    Subjective  "The splint feels good"   Patient is accompained by: Family member   Limitations splint on at all times except hygiene care   Currently in Pain? Yes   Pain Score 6    Pain Location Finger (Comment which one)  thumb   Pain Orientation Right   Pain Descriptors / Indicators Throbbing   Pain Type Surgical pain   Pain Onset In the past 7 days   Pain Frequency Intermittent   Aggravating Factors  night time   Pain Relieving Factors medicine           [facility] OT Assessment - 09/29/14 1420    Assessment   Diagnosis s/p CRPP Rt thumb on 09/26/14 after Bennett's fracture   Onset Date 09/26/14  surgery date   Assessment Pt arrived fully wrapped and protected with scheduled appt. Pt has 2 pins at base of thumb (radially)   Prior Therapy none   Precautions   Precautions Other (comment)   Precaution Comments thumb spica splint on at all times (except hygiene care)   Required Braces or Orthoses Other Brace/Splint  static thumb spica splint per MD orders   Home  Environment   Lives With Family   Prior Function   Level of Independence Independent   ADL   ADL comments Mod I level for BADLS and light cleaning using Lt non dominant hand   Written Expression   Dominant Hand Right   Handwriting --  unable at this time                  OT Treatments/Exercises (OP) - 09/29/14 0001    Splinting   Splinting Pt's soft cast and gauze carefully removed and hand cleaned upon arrival, while keeping thumb and wrist immobolized. Fabricated and fitted static thumb spica splint per MD orders with IP joint free (per protocol). Issued splint               OT Education - 09/29/14 1409    Education  provided Yes   Education Details splint wear and care, precautions, pin site care   Person(s) Educated Patient   Methods Explanation;Handout;Demonstration  demo donning/doffing with pins   Comprehension Verbalized understanding          OT Short Term Goals - 09/29/14 1427    OT SHORT TERM GOAL #1   Title Independent w/ splint wear and care (due 10/29/14)   Baseline issued, may need adjustments   Time 4   Period Weeks   Status On-going   OT SHORT TERM GOAL #2   Title Verbalize understanding with precautions and pin site care   Time 4   Period Weeks   Status Achieved           OT Long Term Goals - 09/29/14 1428    OT LONG TERM GOAL #1   Title Independent w/ HEP (DUE 11/29/14)   Time 8   Period Weeks   Status New   OT LONG TERM GOAL #2   Title Pain less than or  equal to 3/10 with ex's and ADLS   Baseline 6-7/10   Time 8   Period Weeks   Status New   OT LONG TERM GOAL #3   Title Pt to have approx. 75% or greater ROM Rt thumb and wrist    Baseline dependent d/t current precautions   Time 8   Period Weeks   Status New   OT LONG TERM GOAL #4   Title Pt to return to using Rt hand as dominant hand for  ADLS   Baseline Dependent, using Lt hand   Time 8   Period Weeks   Status New               Plan - 09/29/14 1430    Clinical Impression Statement Pt is a 29 y.o. male who presents to outpatient rehab s/p CRPP Rt thumb Bennett's fracture on 09/26/14. Pt arrived fully wrapped and protected for splint fabrication. Thumb spica splint fabricated and issued today per MD orders.    Pt will benefit from skilled therapeutic intervention in order to improve on the following deficits (Retired) Decreased skin integrity;Increased edema;Decreased coordination;Impaired sensation;Pain;Decreased range of motion;Decreased strength;Impaired UE functional use;Decreased knowledge of precautions   Rehab Potential Good   OT Frequency 1x / week   OT Duration 8 weeks   OT Treatment/Interventions Self-care/ADL training;Electrical Stimulation;Therapeutic exercise;Moist Heat;Parrafin;Splinting;Compression bandaging;Fluidtherapy;Scar mobilization;Patient/family education;DME and/or AE instruction;Ultrasound;Manual Therapy;Passive range of motion;Therapeutic activities   Plan splint adjustments prn, (Pt to begin therapy if/when MD clears; per protocol at 6 weeks post-op)   OT Home Exercise Plan splint wear and care issued 09/29/14   Consulted and Agree with Plan of Care Patient        Problem List Patient Active Problem List   Diagnosis Date Noted  . TOBACCO ABUSE 01/22/2010  . 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 DENTAL CARIES 01/22/2010    Kelli Churn, OTR/L 09/29/2014, 2:36 PM   [facility] [address] [place], [place], [address] Phone: [phone]   Fax:  [phone]

## 2014-10-11 ENCOUNTER — Ambulatory Visit: Payer: Self-pay | Admitting: Occupational Therapy

## 2014-10-13 ENCOUNTER — Encounter: Payer: Self-pay | Admitting: Psychology

## 2015-10-11 ENCOUNTER — Encounter (HOSPITAL_COMMUNITY): Payer: Self-pay | Admitting: Emergency Medicine

## 2015-10-11 ENCOUNTER — Emergency Department (HOSPITAL_COMMUNITY)
Admission: EM | Admit: 2015-10-11 | Discharge: 2015-10-11 | Disposition: A | Discharge status: Home / Self Care | Payer: Self-pay | Attending: Emergency Medicine | Admitting: Emergency Medicine

## 2015-10-11 ENCOUNTER — Emergency Department (HOSPITAL_COMMUNITY): Payer: Self-pay

## 2015-10-11 DIAGNOSIS — S62231A Other displaced fracture of base of first metacarpal bone, right hand, initial encounter for closed fracture: Secondary | ICD-10-CM | POA: Insufficient documentation

## 2015-10-11 DIAGNOSIS — F17210 Nicotine dependence, cigarettes, uncomplicated: Secondary | ICD-10-CM | POA: Insufficient documentation

## 2015-10-11 DIAGNOSIS — Y9351 Activity, roller skating (inline) and skateboarding: Secondary | ICD-10-CM | POA: Insufficient documentation

## 2015-10-11 DIAGNOSIS — S62309A Unspecified fracture of unspecified metacarpal bone, initial encounter for closed fracture: Secondary | ICD-10-CM

## 2015-10-11 DIAGNOSIS — Y929 Unspecified place or not applicable: Secondary | ICD-10-CM | POA: Insufficient documentation

## 2015-10-11 DIAGNOSIS — Y998 Other external cause status: Secondary | ICD-10-CM | POA: Insufficient documentation

## 2015-10-11 NOTE — ED Provider Notes (Signed)
CSN: [id number]     Arrival date & time 10/11/15  [id number] History   First MD Initiated Contact with Patient 10/11/15 [phone]     Chief Complaint  Patient presents with  . Hand Injury     (Consider location/radiation/quality/duration/timing/severity/associated sxs/prior Treatment) The history is provided by the patient and medical records. No language interpreter was used.     Mike SilversmithMark Thompson is a 30 y.o. male  With a hx of right hand fracture presents to the Emergency Department complaining of acute, persistent, right hand pain onset 5 days ago after falling off his skateboard.  Pt has been taking ibuprofen 600mg  TID with some relief.  Pt reports hx of Bennet's fracture in June 2016 which was repaired surgically.  He reports pain is worsened with movement.  No open wound.  Decreased ROM of the right thumb.   Pt denies numbness, tingling.     Past Medical History  Diagnosis Date  . Bennett's fracture of base of metacarpal of right thumb 09/16/2014   Past Surgical History  Procedure Laterality Date  . No past surgeries    . Closed reduction finger with percutaneous pinning Right 09/26/2014    Procedure: CLOSED REDUCTION FINGER WITH PERCUTANEOUS PINNING RIGHT THUMB BENNETT'S FRACTURE;  Surgeon: Betha LoaKevin Kuzma, MD;  Location: [facility];  Service: Orthopedics;  Laterality: Right;  block with sedation   History reviewed. No pertinent family history. Social History  Substance Use Topics  . Smoking status: Current Every Day Smoker -- 5 years    Types: Cigarettes  . Smokeless tobacco: Never Used     Comment: 2 cig./day  . Alcohol Use: Yes     Comment: weekends    Review of Systems  Constitutional: Negative for fever and chills.  Gastrointestinal: Negative for nausea and vomiting.  Musculoskeletal: Positive for joint swelling and arthralgias. Negative for back pain, neck pain and neck stiffness.  Skin: Negative for wound.  Neurological: Negative for numbness.  Hematological:  Does not bruise/bleed easily.  Psychiatric/Behavioral: The patient is not nervous/anxious.   All other systems reviewed and are negative.     Allergies  Review of patient's allergies indicates no known allergies.  Home Medications   Prior to Admission medications   Medication Sig Start Date End Date Taking? Authorizing Provider  ibuprofen (ADVIL,MOTRIN) 600 MG tablet Take 1 tablet (600 mg total) by mouth every 6 (six) hours as needed for pain. 08/19/12   Oleh Geninasey Bryant, MD  oxyCODONE-acetaminophen (PERCOCET) 5-325 MG per tablet 1-2 tabs po q6 hours prn pain 09/26/14   Betha LoaKevin Kuzma, MD   BP 120/92 mmHg  Pulse 78  Temp(Src) 98.5 F (36.9 C) (Oral)  Resp 16  SpO2 100% Physical Exam  Constitutional: He appears well-developed and well-nourished. No distress.  HENT:  Head: Normocephalic and atraumatic.  Eyes: Conjunctivae are normal.  Neck: Normal range of motion.  Cardiovascular: Normal rate, regular rhythm and intact distal pulses.   Capillary refill < 3 sec  Pulmonary/Chest: Effort normal and breath sounds normal.  Musculoskeletal: He exhibits tenderness. He exhibits no edema.  Right hand: Tenderness to palpation at the base of the thumb with associated swelling and mild deformity; decreased range of motion of the thumb; sensation intact; strength 4/5 with flexion and extension with pain at the thumb; 5/5 flexion and extension of all fingers; 5/5 flexion and extension of the wrist  Neurological: He is alert. Coordination normal.  Skin: Skin is warm and dry. He is not diaphoretic.  No tenting of the skin  or lacerations  Psychiatric: He has a normal mood and affect.  Nursing note and vitals reviewed.   ED Course  Procedures (including critical care time)  Imaging Review Dg Hand Complete Right  10/11/2015  CLINICAL DATA:  Fall from skateboard with wrist and hand pain, initial encounter EXAM: RIGHT HAND - COMPLETE 3+ VIEW COMPARISON:  09/20/2014 FINDINGS: Irregularity is noted at the  base of first metacarpal in the area of previous fracture. A a re- fracture this area could not be totally excluded on the basis of this exam. No other fractures are seen. No gross soft tissue abnormality is noted. IMPRESSION: Persistent irregularity at the base of the first metacarpal. A re-fracture in this region cannot be totally excluded on the basis of this exam. Electronically Signed   By: Alcide CleverMark  Lukens M.D.   On: 10/11/2015 09:16   I have personally reviewed and evaluated these images and lab results as part of my medical decision-making.  MDM   Final diagnoses:  Metacarpal bone fracture, closed, initial encounter   [facility] with right thumb pain and swelling after fall.  Patient X-Ray with Irregularity at the base of the first metacarpal.  When compared to initial films one year ago this appears to be a re-fracture. Pt advised to follow up with Dr. Merlyn LotKuzma for further evaluation and treatment. Patient given thumb spica while in ED, conservative therapy recommended and discussed. Patient will be dc home & is agreeable with above plan. I have also discussed reasons to return immediately to the ER.  Patient expresses understanding and agrees with plan.    Dahlia ClientHannah Novia Lansberry, PA-C 10/11/15 1110  Benjiman CoreNathan Pickering, MD 10/11/15 [phone]

## 2015-10-11 NOTE — ED Notes (Addendum)
Pt fell from skateboard onto concrete on Saturday, caught self on right hand, c/o right hand pain. Hx of surgery on that hand with screws placed. Point tenderness to right thumb and snuffbox.

## 2015-10-11 NOTE — Discharge Instructions (Signed)
1. Medications: alternate ibuprofen 800 mg 3 times per day and tylenol up to 4 g in 24 hours for pain control, usual home medications 2. Treatment: rest, ice, elevate and use brace, drink plenty of fluids, keep cast dry 3. Follow Up: Please followup with orthopedics as directed or your PCP in 1 week if no improvement for discussion of your diagnoses and further evaluation after today's visit; if you do not have a primary care doctor use the resource guide provided to find one; Please return to the ER for worsening symptoms or other concerns    Cast or Splint Care Casts and splints support injured limbs and keep bones from moving while they heal.  HOME CARE  Keep the cast or splint uncovered during the drying period.  A plaster cast can take 24 to 48 hours to dry.  A fiberglass cast will dry in less than 1 hour.  Do not rest the cast on anything harder than a pillow for 24 hours.  Do not put weight on your injured limb. Do not put pressure on the cast. Wait for your doctor's approval.  Keep the cast or splint dry.  Cover the cast or splint with a plastic bag during baths or wet weather.  If you have a cast over your chest and belly (trunk), take sponge baths until the cast is taken off.  If your cast gets wet, dry it with a towel or blow dryer. Use the cool setting on the blow dryer.  Keep your cast or splint clean. Wash a dirty cast with a damp cloth.  Do not put any objects under your cast or splint.  Do not scratch the skin under the cast with an object. If itching is a problem, use a blow dryer on a cool setting over the itchy area.  Do not trim or cut your cast.  Do not take out the padding from inside your cast.  Exercise your joints near the cast as told by your doctor.  Raise (elevate) your injured limb on 1 or 2 pillows for the first 1 to 3 days. GET HELP IF:  Your cast or splint cracks.  Your cast or splint is too tight or too loose.  You itch badly under the  cast.  Your cast gets wet or has a soft spot.  You have a bad smell coming from the cast.  You get an object stuck under the cast.  Your skin around the cast becomes red or sore.  You have new or more pain after the cast is put on. GET HELP RIGHT AWAY IF:  You have fluid leaking through the cast.  You cannot move your fingers or toes.  Your fingers or toes turn blue or white or are cool, painful, or puffy (swollen).  You have tingling or lose feeling (numbness) around the injured area.  You have bad pain or pressure under the cast.  You have trouble breathing or have shortness of breath.  You have chest pain.   This information is not intended to replace advice given to you by your health care provider. Make sure you discuss any questions you have with your health care provider.   Document Released: 08/01/2010 Document Revised: 12/02/2012 Document Reviewed: 10/08/2012 Elsevier Interactive Patient Education [employer]2016 Elsevier Inc.

## 2015-10-11 NOTE — ED Notes (Signed)
PA at bedside.

## 2015-10-20 ENCOUNTER — Other Ambulatory Visit (HOSPITAL_COMMUNITY): Payer: Self-pay | Admitting: Orthopedic Surgery

## 2015-10-20 DIAGNOSIS — S62212A Bennett's fracture, left hand, initial encounter for closed fracture: Secondary | ICD-10-CM

## 2015-10-27 ENCOUNTER — Ambulatory Visit (HOSPITAL_COMMUNITY)
Admission: RE | Admit: 2015-10-27 | Discharge: 2015-10-27 | Disposition: A | Discharge status: Home / Self Care | Payer: Self-pay | Source: Ambulatory Visit | Attending: Orthopedic Surgery | Admitting: Orthopedic Surgery

## 2015-10-27 DIAGNOSIS — S62212A Bennett's fracture, left hand, initial encounter for closed fracture: Secondary | ICD-10-CM | POA: Insufficient documentation

## 2015-10-27 DIAGNOSIS — R936 Abnormal findings on diagnostic imaging of limbs: Secondary | ICD-10-CM | POA: Insufficient documentation

## 2015-10-27 DIAGNOSIS — X58XXXA Exposure to other specified factors, initial encounter: Secondary | ICD-10-CM | POA: Insufficient documentation

## 2015-11-14 DIAGNOSIS — S62231D Other displaced fracture of base of first metacarpal bone, right hand, subsequent encounter for fracture with routine healing: Secondary | ICD-10-CM | POA: Insufficient documentation

## 2016-11-05 ENCOUNTER — Encounter (HOSPITAL_COMMUNITY): Payer: Self-pay | Admitting: Obstetrics and Gynecology

## 2016-11-05 ENCOUNTER — Emergency Department (HOSPITAL_COMMUNITY)
Admission: EM | Admit: 2016-11-05 | Discharge: 2016-11-05 | Disposition: A | Discharge status: Home / Self Care | Payer: Self-pay | Attending: Emergency Medicine | Admitting: Emergency Medicine

## 2016-11-05 DIAGNOSIS — S60512A Abrasion of left hand, initial encounter: Secondary | ICD-10-CM | POA: Insufficient documentation

## 2016-11-05 DIAGNOSIS — Y929 Unspecified place or not applicable: Secondary | ICD-10-CM | POA: Insufficient documentation

## 2016-11-05 DIAGNOSIS — S80211A Abrasion, right knee, initial encounter: Secondary | ICD-10-CM | POA: Insufficient documentation

## 2016-11-05 DIAGNOSIS — Y999 Unspecified external cause status: Secondary | ICD-10-CM | POA: Insufficient documentation

## 2016-11-05 DIAGNOSIS — S80212A Abrasion, left knee, initial encounter: Secondary | ICD-10-CM | POA: Insufficient documentation

## 2016-11-05 DIAGNOSIS — S60511A Abrasion of right hand, initial encounter: Secondary | ICD-10-CM | POA: Insufficient documentation

## 2016-11-05 DIAGNOSIS — Y939 Activity, unspecified: Secondary | ICD-10-CM | POA: Insufficient documentation

## 2016-11-05 DIAGNOSIS — S70212A Abrasion, left hip, initial encounter: Secondary | ICD-10-CM | POA: Insufficient documentation

## 2016-11-05 DIAGNOSIS — X58XXXA Exposure to other specified factors, initial encounter: Secondary | ICD-10-CM | POA: Insufficient documentation

## 2016-11-05 DIAGNOSIS — F17210 Nicotine dependence, cigarettes, uncomplicated: Secondary | ICD-10-CM | POA: Insufficient documentation

## 2016-11-05 DIAGNOSIS — T07XXXA Unspecified multiple injuries, initial encounter: Secondary | ICD-10-CM

## 2016-11-05 HISTORY — DX: Schizophrenia, unspecified: F20.9

## 2016-11-05 MED ORDER — BACITRACIN ZINC 500 UNIT/GM EX OINT
TOPICAL_OINTMENT | CUTANEOUS | Status: AC
  Administered 2016-11-05: 3
  Filled 2016-11-05: qty 3.6

## 2016-11-05 NOTE — ED Provider Notes (Signed)
[facility]-EMERGENCY DEPT Provider Note   CSN: [id number] Arrival date & time: 11/05/16  1824     History   Chief Complaint Chief Complaint  Patient presents with  . Abrasion    HPI Mike Thompson is a 31 y.o. male [facility]. Patient has a history of schizophrenia and states that he was drinking with friends earlier and smells of alcohol. He complains of abrasions to his hips, hands and legs. He states that he is up-to-date on his tetanus vaccination. Patient complains that the place where "corrupt and trying to kill me, like [place]vel[place] Peat[place] Gar[place]!" He denies head injury.  HPI  Past Medical History:  Diagnosis Date  . Bennett's fracture of base of metacarpal of right thumb 09/16/2014  . Schizophrenia [facility](HCC)     Patient Active Problem List   Diagnosis Date Noted  . TOBACCO ABUSE 01/22/2010  . 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 DENTAL CARIES 01/22/2010    Past Surgical History:  Procedure Laterality Date  . CLOSED REDUCTION FINGER WITH PERCUTANEOUS PINNING Right 09/26/2014   Procedure: CLOSED REDUCTION FINGER WITH PERCUTANEOUS PINNING RIGHT THUMB BENNETT'S FRACTURE;  Surgeon: Betha LoaKevin Kuzma, MD;  Location: [facility];  Service: Orthopedics;  Laterality: Right;  block with sedation  . NO PAST SURGERIES         Home Medications    Prior to Admission medications   Medication Sig Start Date End Date Taking? Authorizing Provider  ibuprofen (ADVIL,MOTRIN) 600 MG tablet Take 1 tablet (600 mg total) by mouth every 6 (six) hours as needed for pain. 08/19/12   Oleh GeninBryant, Casey, MD  oxyCODONE-acetaminophen [facility](PERCOCET) 5-325 MG per tablet 1-2 tabs po q6 hours prn pain 09/26/14   Betha LoaKuzma, Kevin, MD    Family History No family history on file.  Social History Social History  Substance Use Topics  . Smoking status: Current Every Day Smoker    Years: 5.00    Types: Cigarettes  . Smokeless tobacco: Never Used     Comment: 2 cig./day  .  Alcohol use Yes     Comment: weekends     Allergies   Patient has no known allergies.   Review of Systems Review of Systems  Ten systems reviewed and are negative for acute change, except as noted in the HPI. \ Physical Exam Updated Vital Signs BP (!) 175/72 (BP Location: Left Arm)   Pulse 100   Temp 99.2 F (37.3 C) (Oral)   Resp 16   Ht 5\' 7"  (1.702 m)   Wt 70.3 kg (155 lb)   SpO2 96%   BMI 24.28 kg/m   Physical Exam  Constitutional: He appears well-developed and well-nourished. No distress.  HENT:  Head: Normocephalic and atraumatic.  Eyes: Conjunctivae are normal. No scleral icterus.  Neck: Normal range of motion. Neck supple.  Cardiovascular: Normal rate, regular rhythm and normal heart sounds.   Pulmonary/Chest: Effort normal and breath sounds normal. No respiratory distress.  Abdominal: Soft. There is no tenderness.  Musculoskeletal: He exhibits no edema.  Neurological: He is alert.  Skin: Skin is warm and dry. He is not diaphoretic.  Abrasions to the Knees, Left hip, Left palm, and fingers of the R Hand. No active bleeding.  Psychiatric: His behavior is normal.  Nursing note and vitals reviewed.    ED Treatments / Results  Labs (all labs ordered are listed, but only abnormal results are displayed) Labs Reviewed - No data to display  EKG  EKG Interpretation  None       Radiology No results found.  Procedures Procedures (including critical care time)  Medications Ordered in ED Medications  bacitracin 500 UNIT/GM ointment (3 application  Given 11/05/16 1934)     Initial Impression / Assessment and Plan / ED Course  I have reviewed the triage vital signs and the nursing notes.  Pertinent labs & imaging results that were available during my care of the patient were reviewed by me and considered in my medical decision making (see chart for details).     Patient with multiple abrasions. tdap updated.  the Patient's wounds were cleansed,  dressings applied. He appears safe for discharge into [employer] custody.  Final Clinical Impressions(s) / ED Diagnoses   Final diagnoses:  Abrasions of multiple sites    New Prescriptions Discharge Medication List as of 11/05/2016  7:27 PM       Arthor Captain, PA-C 11/08/16 1811    Alvira Monday, MD 11/11/16 1014

## 2016-11-05 NOTE — ED Triage Notes (Signed)
Per Police official:  Pt was being banned from a place, and decided to flee. Police gave chase. Pt did a flying leap and landed in an ant pile. Pt reports that he has schizophrenia and has not had his medicine in about a month.  Pt is belligerent and yelling about "corrupt police." pt is asking to press charges against police.  Pt is currently handcuffed to the bed with police at bedside. Pt has multiple abrasion to hands, hip, and knees

## 2016-11-05 NOTE — Discharge Instructions (Signed)
Wash and change dressings daily Return for signs of infection (fever, pus, red streaking) Tylenol or motrin for pain.

## 2017-02-26 ENCOUNTER — Encounter (HOSPITAL_COMMUNITY): Payer: Self-pay | Admitting: *Deleted

## 2017-02-26 ENCOUNTER — Other Ambulatory Visit: Payer: Self-pay

## 2017-02-26 ENCOUNTER — Emergency Department (HOSPITAL_COMMUNITY)
Admission: EM | Admit: 2017-02-26 | Discharge: 2017-02-26 | Disposition: A | Discharge status: Home / Self Care | Payer: Self-pay | Attending: Emergency Medicine | Admitting: Emergency Medicine

## 2017-02-26 DIAGNOSIS — K920 Hematemesis: Secondary | ICD-10-CM | POA: Insufficient documentation

## 2017-02-26 DIAGNOSIS — K2901 Acute gastritis with bleeding: Secondary | ICD-10-CM | POA: Insufficient documentation

## 2017-02-26 DIAGNOSIS — Z79899 Other long term (current) drug therapy: Secondary | ICD-10-CM | POA: Insufficient documentation

## 2017-02-26 DIAGNOSIS — F17210 Nicotine dependence, cigarettes, uncomplicated: Secondary | ICD-10-CM | POA: Insufficient documentation

## 2017-02-26 LAB — URINALYSIS, ROUTINE W REFLEX MICROSCOPIC
Bilirubin Urine: NEGATIVE
Glucose, UA: NEGATIVE mg/dL
Hgb urine dipstick: NEGATIVE
Ketones, ur: NEGATIVE mg/dL
Leukocytes, UA: NEGATIVE
Nitrite: NEGATIVE
PROTEIN: NEGATIVE mg/dL
Specific Gravity, Urine: 1.023 (ref 1.005–1.030)
pH: 7 (ref 5.0–8.0)

## 2017-02-26 LAB — COMPREHENSIVE METABOLIC PANEL
ALK PHOS: 44 U/L (ref 38–126)
ALT: 20 U/L (ref 17–63)
ANION GAP: 7 (ref 5–15)
AST: 25 U/L (ref 15–41)
Albumin: 4.2 g/dL (ref 3.5–5.0)
BUN: 13 mg/dL (ref 6–20)
CO2: 25 mmol/L (ref 22–32)
Calcium: 9.1 mg/dL (ref 8.9–10.3)
Chloride: 105 mmol/L (ref 101–111)
Creatinine, Ser: 1.06 mg/dL (ref 0.61–1.24)
Glucose, Bld: 104 mg/dL — ABNORMAL HIGH (ref 65–99)
POTASSIUM: 4.1 mmol/L (ref 3.5–5.1)
Sodium: 137 mmol/L (ref 135–145)
TOTAL PROTEIN: 7.4 g/dL (ref 6.5–8.1)
Total Bilirubin: 1.2 mg/dL (ref 0.3–1.2)

## 2017-02-26 LAB — CBC
HCT: 40.2 % (ref 39.0–52.0)
HEMOGLOBIN: 13.7 g/dL (ref 13.0–17.0)
MCH: 33 pg (ref 26.0–34.0)
MCHC: 34.1 g/dL (ref 30.0–36.0)
MCV: 96.9 fL (ref 78.0–100.0)
Platelets: 287 10*3/uL (ref 150–400)
RBC: 4.15 MIL/uL — ABNORMAL LOW (ref 4.22–5.81)
RDW: 12.7 % (ref 11.5–15.5)
WBC: 4 10*3/uL (ref 4.0–10.5)

## 2017-02-26 LAB — LIPASE, BLOOD: Lipase: 24 U/L (ref 11–51)

## 2017-02-26 MED ORDER — PANTOPRAZOLE SODIUM 40 MG IV SOLR
40.0000 mg | Freq: Once | INTRAVENOUS | Status: AC
  Administered 2017-02-26: 40 mg via INTRAVENOUS
  Filled 2017-02-26: qty 40

## 2017-02-26 MED ORDER — SODIUM CHLORIDE 0.9 % IV BOLUS (SEPSIS)
1000.0000 mL | Freq: Once | INTRAVENOUS | Status: AC
  Administered 2017-02-26: 1000 mL via INTRAVENOUS

## 2017-02-26 MED ORDER — FAMOTIDINE 20 MG PO TABS: 20.0000 mg | ORAL_TABLET | Freq: Two times a day (BID) | ORAL | 0 refills | Status: DC

## 2017-02-26 NOTE — Discharge Instructions (Signed)
Avoid ibuprofen and alcohol.  Prescription for irritated stomach.  Clear liquids for the next several hours.  Return if worse.

## 2017-02-26 NOTE — ED Triage Notes (Signed)
Pt states vomiting since Sat and started noticing blood in his vomit yesterday.  Feels much better today, but still unable to tolerate po.

## 2017-02-26 NOTE — ED Provider Notes (Signed)
[facility] Provider Note   CSN: [id number] Arrival date & time: 02/26/17  [id number]     History   Chief Complaint Chief Complaint  Patient presents with  . Hematemesis    HPI Mike Thompson is a 31 y.o. male.  Patient reports blood in his vomit for the past 2 days.  He was unable to quantify the amount, but he did notice red blood.  He claims to have taken ibuprofen 600 mg 4 tablets in a short amount of time.  He also drinks alcohol and smokes cigarettes.Marland Kitchen.  He takes Zyprexa for schizophrenia.  No dehydration, black stool, epigastric discomfort.  No evidence of hematemesis in the emergency department.      Past Medical History:  Diagnosis Date  . Bennett's fracture of base of metacarpal of right thumb 09/16/2014  . Schizophrenia [facility](HCC)     Patient Active Problem List   Diagnosis Date Noted  . TOBACCO ABUSE 01/22/2010  . 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 DENTAL CARIES 01/22/2010    Past Surgical History:  Procedure Laterality Date  . NO PAST SURGERIES         Home Medications    Prior to Admission medications   Medication Sig Start Date End Date Taking? Authorizing Provider  ibuprofen (ADVIL,MOTRIN) 600 MG tablet Take 1 tablet (600 mg total) by mouth every 6 (six) hours as needed for pain. 08/19/12  Yes Oleh GeninBryant, Casey, MD  OLANZapine (ZYPREXA) 2.5 MG tablet Take 2.5 mg at bedtime by mouth.   Yes [provider]  famotidine (PEPCID) 20 MG tablet Take 1 tablet (20 mg total) 2 (two) times daily by mouth. 02/26/17   Donnetta Hutchingook, Rhet Rorke, MD  oxyCODONE-acetaminophen [facility](PERCOCET) 5-325 MG per tablet 1-2 tabs po q6 hours prn pain Patient not taking: Reported on 02/26/2017 09/26/14   Betha LoaKuzma, Kevin, MD    Family History No family history on file.  Social History Social History   Tobacco Use  . Smoking status: Current Every Day Smoker    Packs/day: 0.25    Years: 5.00    Pack years: 1.25    Types: Cigarettes  .  Smokeless tobacco: Never Used  . Tobacco comment: 2 cig./day  Substance Use Topics  . Alcohol use: Yes    Comment: weekends  . Drug use: Yes    Types: Marijuana     Allergies   Patient has no known allergies.   Review of Systems Review of Systems  All other systems reviewed and are negative.    Physical Exam Updated Vital Signs BP 127/85   Pulse 71   Temp 98.1 F (36.7 C) (Oral)   Resp 16   Ht 5\' 7"  (1.702 m)   Wt 74.8 kg (165 lb)   SpO2 100%   BMI 25.84 kg/m   Physical Exam  Constitutional: He is oriented to person, place, and time. He appears well-developed and well-nourished.  HENT:  Head: Normocephalic and atraumatic.  Eyes: Conjunctivae are normal.  Neck: Neck supple.  Cardiovascular: Normal rate and regular rhythm.  Pulmonary/Chest: Effort normal and breath sounds normal.  Abdominal:  Minimal epigastric tenderness  Musculoskeletal: Normal range of motion.  Neurological: He is alert and oriented to person, place, and time.  Skin: Skin is warm and dry.  Psychiatric: He has a normal mood and affect. His behavior is normal.  Nursing note and vitals reviewed.    ED Treatments / Results  Labs (all labs ordered are listed, but only abnormal results are  displayed) Labs Reviewed  COMPREHENSIVE METABOLIC PANEL - Abnormal; Notable for the following components:      Result Value   Glucose, Bld 104 (*)    All other components within normal limits  CBC - Abnormal; Notable for the following components:   RBC 4.15 (*)    All other components within normal limits  LIPASE, BLOOD  URINALYSIS, ROUTINE W REFLEX MICROSCOPIC    EKG  EKG Interpretation None       Radiology No results found.  Procedures Procedures (including critical care time)  Medications Ordered in ED Medications  sodium chloride 0.9 % bolus 1,000 mL (1,000 mLs Intravenous New Bag/Given 02/26/17 1317)  pantoprazole (PROTONIX) injection 40 mg (40 mg Intravenous Given 02/26/17 1317)      Initial Impression / Assessment and Plan / ED Course  I have reviewed the triage vital signs and the nursing notes.  Pertinent labs & imaging results that were available during my care of the patient were reviewed by me and considered in my medical decision making (see chart for details).     Patient is hemodynamically stable.  I suspect that the ibuprofen and alcohol contributed to his mild hematemesis.  His hemoglobin here is stable.  No acute abdomen.  He was given IV fluids and IV Protonix.  Discharge medications Pepcid 20 mg bid for 2 weeks.  He was instructed to avoid ibuprofen and alcohol.  Final Clinical Impressions(s) / ED Diagnoses   Final diagnoses:  Hematemesis, presence of nausea not specified  Acute gastritis with hemorrhage, unspecified gastritis type    ED Discharge Orders        Ordered    famotidine (PEPCID) 20 MG tablet  2 times daily     02/26/17 1348       Donnetta Hutchingook, Lissy Deuser, MD 02/26/17 1354

## 2017-04-23 ENCOUNTER — Emergency Department (HOSPITAL_COMMUNITY): Payer: Self-pay

## 2017-04-23 ENCOUNTER — Other Ambulatory Visit: Payer: Self-pay

## 2017-04-23 ENCOUNTER — Emergency Department (HOSPITAL_COMMUNITY)
Admission: EM | Admit: 2017-04-23 | Discharge: 2017-04-23 | Disposition: A | Discharge status: Home / Self Care | Payer: Self-pay | Attending: Emergency Medicine | Admitting: Emergency Medicine

## 2017-04-23 ENCOUNTER — Encounter (HOSPITAL_COMMUNITY): Payer: Self-pay

## 2017-04-23 DIAGNOSIS — F17210 Nicotine dependence, cigarettes, uncomplicated: Secondary | ICD-10-CM | POA: Insufficient documentation

## 2017-04-23 DIAGNOSIS — R059 Cough, unspecified: Secondary | ICD-10-CM

## 2017-04-23 DIAGNOSIS — R05 Cough: Secondary | ICD-10-CM | POA: Insufficient documentation

## 2017-04-23 DIAGNOSIS — Z79899 Other long term (current) drug therapy: Secondary | ICD-10-CM | POA: Insufficient documentation

## 2017-04-23 DIAGNOSIS — R1084 Generalized abdominal pain: Secondary | ICD-10-CM | POA: Insufficient documentation

## 2017-04-23 DIAGNOSIS — R319 Hematuria, unspecified: Secondary | ICD-10-CM | POA: Insufficient documentation

## 2017-04-23 LAB — COMPREHENSIVE METABOLIC PANEL
ALK PHOS: 50 U/L (ref 38–126)
ALT: 29 U/L (ref 17–63)
ANION GAP: 13 (ref 5–15)
AST: 30 U/L (ref 15–41)
Albumin: 4.1 g/dL (ref 3.5–5.0)
BUN: 8 mg/dL (ref 6–20)
CO2: 24 mmol/L (ref 22–32)
Calcium: 9 mg/dL (ref 8.9–10.3)
Chloride: 102 mmol/L (ref 101–111)
Creatinine, Ser: 0.94 mg/dL (ref 0.61–1.24)
GFR calc Af Amer: >60 mL/min (ref >60)
GFR calc non Af Amer: >60 mL/min (ref >60)
Glucose, Bld: 108 mg/dL — ABNORMAL HIGH (ref 65–99)
POTASSIUM: 3.7 mmol/L (ref 3.5–5.1)
Sodium: 139 mmol/L (ref 135–145)
Total Bilirubin: 0.7 mg/dL (ref 0.3–1.2)
Total Protein: 7.9 g/dL (ref 6.5–8.1)

## 2017-04-23 LAB — URINALYSIS, ROUTINE W REFLEX MICROSCOPIC
Bilirubin Urine: NEGATIVE
GLUCOSE, UA: NEGATIVE mg/dL
Ketones, ur: NEGATIVE mg/dL
Leukocytes, UA: NEGATIVE
NITRITE: NEGATIVE
PROTEIN: 100 mg/dL — AB
SPECIFIC GRAVITY, URINE: 1.018 (ref 1.005–1.030)
pH: 5 (ref 5.0–8.0)

## 2017-04-23 LAB — CBC
HEMATOCRIT: 39.3 % (ref 39.0–52.0)
HEMOGLOBIN: 13.2 g/dL (ref 13.0–17.0)
MCH: 32.4 pg (ref 26.0–34.0)
MCHC: 33.6 g/dL (ref 30.0–36.0)
MCV: 96.6 fL (ref 78.0–100.0)
Platelets: 354 10*3/uL (ref 150–400)
RBC: 4.07 MIL/uL — ABNORMAL LOW (ref 4.22–5.81)
RDW: 12.4 % (ref 11.5–15.5)
WBC: 7.6 10*3/uL (ref 4.0–10.5)

## 2017-04-23 LAB — LIPASE, BLOOD: Lipase: 114 U/L — ABNORMAL HIGH (ref 11–51)

## 2017-04-23 LAB — D-DIMER, QUANTITATIVE: D-Dimer, Quant: 0.33 ug/mL-FEU (ref 0.00–0.50)

## 2017-04-23 MED ORDER — BENZONATATE 100 MG PO CAPS: 100.0000 mg | ORAL_CAPSULE | Freq: Three times a day (TID) | ORAL | 0 refills | Status: DC | PRN

## 2017-04-23 MED ORDER — ONDANSETRON HCL 4 MG PO TABS: 4.0000 mg | ORAL_TABLET | Freq: Three times a day (TID) | ORAL | 0 refills | Status: DC | PRN

## 2017-04-23 MED ORDER — IOPAMIDOL (ISOVUE-300) INJECTION 61%
INTRAVENOUS | Status: AC
  Filled 2017-04-23: qty 100

## 2017-04-23 NOTE — ED Triage Notes (Signed)
Pt states that he has had a dry cough for the past two week with some blood tinged sputum. Pt also reports abd pain, generalized, some vomiting and diarrhea

## 2017-04-23 NOTE — ED Provider Notes (Signed)
[facility] EMERGENCY DEPARTMENT Provider Note   CSN: [id number] Arrival date & time: 04/23/17  4098     History   Chief Complaint Chief Complaint  Patient presents with  . Cough  . Abdominal Pain    HPI Mike Thompson is a 32 y.o. male presents today with chief complaint gradual onset, progressively worsening cough and abdominal pain for 2 weeks.  He notes he developed hemoptysis several months ago which improved but then returned 2 weeks ago.  He notes that his sputum is "speckled "with blood.  He states that this worsened overnight and that he has had "coughing fits "which have also resulted in posttussive emesis over the past 2 weeks.  He does note hematemesis during these times.  He also states that he developed constant sore abdominal pain which he describes as generalized but worse in the bilateral flanks.  Pain worsens with palpation and movement.  He notes intermittent diarrhea but has had normal bowel movements in between in the past 2 weeks.  He denies dysuria but notes hematuria and urgency and frequency as well as decreased urine output.  He is currently sexually active with one male partner and states that he uses protection every time he has intercourse.  He states that he is sexually active with one male partner and states that he recently was tested for STDs.  He declines STD testing today.  He denies abnormal drainage, testicular pain, or genital sores.  He notes that he drinks at least 120 mL's of beer daily and drinks liquor on the weekends.  He does not smoke cigarettes but smokes marijuana multiple times daily.  No recent travel or surgeries, no prior history of DVT or PE, and he is not on testosterone hormone placement therapy.   The history is provided by the patient.    Past Medical History:  Diagnosis Date  . Bennett's fracture of base of metacarpal of right thumb 09/16/2014  . Schizophrenia [facility])     Patient Active Problem List   Diagnosis Date Noted  . TOBACCO ABUSE 01/22/2010  . 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 DENTAL CARIES 01/22/2010    Past Surgical History:  Procedure Laterality Date  . CLOSED REDUCTION FINGER WITH PERCUTANEOUS PINNING Right 09/26/2014   Procedure: CLOSED REDUCTION FINGER WITH PERCUTANEOUS PINNING RIGHT THUMB BENNETT'S FRACTURE;  Surgeon: Betha Loa, MD;  Location: [facility];  Service: Orthopedics;  Laterality: Right;  block with sedation  . NO PAST SURGERIES         Home Medications    Prior to Admission medications   Medication Sig Start Date End Date Taking? Authorizing Provider  OLANZapine (ZYPREXA) 2.5 MG tablet Take 2.5 mg at bedtime by mouth.   Yes [provider]  benzonatate (TESSALON) 100 MG capsule Take 1 capsule (100 mg total) by mouth 3 (three) times daily as needed for cough. 04/23/17   Toan Mort A, PA-C  famotidine (PEPCID) 20 MG tablet Take 1 tablet (20 mg total) 2 (two) times daily by mouth. Patient not taking: Reported on 04/23/2017 02/26/17   Donnetta Hutching, MD  ibuprofen (ADVIL,MOTRIN) 600 MG tablet Take 1 tablet (600 mg total) by mouth every 6 (six) hours as needed for pain. Patient not taking: Reported on 04/23/2017 08/19/12   Oleh Genin, MD  ondansetron (ZOFRAN) 4 MG tablet Take 1 tablet (4 mg total) by mouth every 8 (eight) hours as needed for nausea or vomiting. 04/23/17   Jeanie Sewer, PA-C  oxyCODONE-acetaminophen (PERCOCET) 5-325 MG per tablet 1-2 tabs po q6 hours prn pain Patient not taking: Reported on 02/26/2017 09/26/14   Betha LoaKuzma, Kevin, MD    Family History No family history on file.  Social History Social History   Tobacco Use  . Smoking status: Current Every Day Smoker    Packs/day: 0.25    Years: 5.00    Pack years: 1.25    Types: Cigarettes  . Smokeless tobacco: Never Used  . Tobacco comment: 2 cig./day  Substance Use Topics  . Alcohol use: Yes    Comment: weekends  . Drug use: Yes    Types: Marijuana      Allergies   Patient has no known allergies.   Review of Systems Review of Systems  Constitutional: Negative for chills and fever.  Respiratory: Positive for cough, chest tightness and shortness of breath.   Cardiovascular: Negative for chest pain, palpitations and leg swelling.  Gastrointestinal: Positive for abdominal pain, diarrhea, nausea and vomiting. Negative for blood in stool.  Genitourinary: Positive for decreased urine volume, frequency and hematuria. Negative for discharge, dysuria, genital sores, penile pain, penile swelling, scrotal swelling and testicular pain.  All other systems reviewed and are negative.    Physical Exam Updated Vital Signs BP 131/84 (BP Location: Right Arm)   Pulse 87   Temp 98.4 F (36.9 C) (Oral)   Resp 16   SpO2 98%   Physical Exam  Constitutional: He appears well-developed and well-nourished. No distress.  HENT:  Head: Normocephalic and atraumatic.  Eyes: Conjunctivae and EOM are normal. Pupils are equal, round, and reactive to light. Right eye exhibits no discharge. Left eye exhibits no discharge.  Neck: No JVD present. No tracheal deviation present.  Cardiovascular:  HR 93 in the room, 2+ radial and DP/PT pulses bl, negative Homan's bl   Pulmonary/Chest: Effort normal. No stridor. No respiratory distress. He has no wheezes. He has no rhonchi. He has no rales. He exhibits no tenderness.  Abdominal: Soft. Bowel sounds are normal. He exhibits no distension. There is no hepatosplenomegaly. There is generalized tenderness. There is CVA tenderness. There is no rigidity, no rebound, no guarding, no tenderness at McBurney's point and negative Murphy's sign.  Musculoskeletal: He exhibits no edema.  Neurological: He is alert.  Skin: Skin is warm and dry. No erythema.  Psychiatric: He has a normal mood and affect. His behavior is normal.  Nursing note and vitals reviewed.    ED Treatments / Results  Labs (all labs ordered are listed,  but only abnormal results are displayed) Labs Reviewed  LIPASE, BLOOD - Abnormal; Notable for the following components:      Result Value   Lipase 114 (*)    All other components within normal limits  COMPREHENSIVE METABOLIC PANEL - Abnormal; Notable for the following components:   Glucose, Bld 108 (*)    All other components within normal limits  CBC - Abnormal; Notable for the following components:   RBC 4.07 (*)    All other components within normal limits  URINALYSIS, ROUTINE W REFLEX MICROSCOPIC - Abnormal; Notable for the following components:   APPearance HAZY (*)    Hgb urine dipstick LARGE (*)    Protein, ur 100 (*)    Bacteria, UA RARE (*)    Squamous Epithelial / LPF 0-5 (*)    All other components within normal limits  URINE CULTURE  D-DIMER, QUANTITATIVE (NOT AT [facility])    EKG  EKG Interpretation None  Radiology Dg Chest 2 View  Result Date: 04/23/2017 CLINICAL DATA:  Cough and recent fever EXAM: CHEST  2 VIEW COMPARISON:  Aug 19, 2012 FINDINGS: Lungs are clear. Heart size and pulmonary vascularity are normal. No adenopathy. No bone lesions. IMPRESSION: No edema or consolidation. Electronically Signed   By: Bretta Bang III M.D.   On: 04/23/2017 07:38   Ct Abdomen Pelvis W Contrast  Result Date: 04/23/2017 CLINICAL DATA:  Diffuse abdominal pain for 2 weeks. Suspect pancreatitis. EXAM: CT ABDOMEN AND PELVIS WITH CONTRAST TECHNIQUE: Multidetector CT imaging of the abdomen and pelvis was performed using the standard protocol following bolus administration of intravenous contrast. CONTRAST:  100 cc Isovue-300 COMPARISON:  None. FINDINGS: LOWER CHEST: Trace bibasilar lower lobe subpleural scarring or atelectasis. Included heart size is normal. No pericardial effusion. HEPATOBILIARY: Liver is diffusely hypodense without intrahepatic biliary dilatation or mass. Normal gallbladder. PANCREAS: Normal. SPLEEN: Normal. ADRENALS/URINARY TRACT: Kidneys are orthotopic,  demonstrating symmetric enhancement. No nephrolithiasis, hydronephrosis or solid renal masses. The unopacified ureters are normal in course and caliber. Urinary bladder is well distended and unremarkable. Normal adrenal glands. STOMACH/BOWEL: The stomach, small and large bowel are normal in course and caliber without inflammatory changes. Mild transverse colon diverticulosis. Normal appendix. VASCULAR/LYMPHATIC: Aortoiliac vessels are normal in course and caliber. No lymphadenopathy by CT size criteria. REPRODUCTIVE: Borderline prostatomegaly. OTHER: No intraperitoneal free fluid or free air. Small fat containing umbilical hernia. MUSCULOSKELETAL: Nonacute.  Scattered Schmorl's nodes. IMPRESSION: 1. No acute intra-or pelvic process. 2. Hepatic steatosis. Electronically Signed   By: Awilda Metro M.D.   On: 04/23/2017 13:42    Procedures Procedures (including critical care time)  Medications Ordered in ED Medications  iopamidol (ISOVUE-300) 61 % injection (not administered)     Initial Impression / Assessment and Plan / ED Course  I have reviewed the triage vital signs and the nursing notes.  Pertinent labs & imaging results that were available during my care of the patient were reviewed by me and considered in my medical decision making (see chart for details).    Patient presents with cough and abdominal pain for 2 weeks.  Initially tachycardic with improvement while in the ED. He does have an elevated lipase however admits to chronic alcohol use.  He is nontoxic in appearance and tolerating p.o. food and fluids while in the ED. No leukocytosis, no significant electrolyte abnormalities. Suspect hematemesis likely secondary to Mallory-Weiss tear and not likely to be due to esophageal varies or Boer[place]ave.  He has not had any episodes of vomiting in the ED.  CT scan of the abdomen shows no acute intra-abdominal or pelvic processes.  It does show hepatic steatosis consistent with his history of  alcohol abuse.  I doubt obstruction, perforation, mesenteric ischemia, nephrolithiasis, or other acute surgical abdominal pathology.  UA does show hematuria but patient's physical examination and presentation do not seem consistent with nephrolithiasis.  Patient states this does not feel like the last time he had kidney stones.  D-dimer is negative and I doubt PE in this patient.  Chest x-ray shows no acute abnormalities, no evidence of pneumonia, pleural effusion, or bronchitis.  He has no chest pain.  Suspect small amount of blood streaking in his sputum secondary to chronic cough and epithelial sloughing/irritation.  On reevaluation, patient is resting comfortably, tolerating p.o. food and fluids without difficulty, and repeat abdominal examination is unremarkable.  He is ambulatory and remains nontoxic in appearance.  He is requesting discharge at this time because he has  to go home to his children.  He will follow-up with primary care for reevaluation of his hematuria.  Will discharge with Zofran for nausea and Tessalon for cough.  Discussed indications for return to the ED. Pt verbalized understanding of and agreement with plan and is safe for discharge home at this time.  No complaints prior to discharge.  Final Clinical Impressions(s) / ED Diagnoses   Final diagnoses:  Cough in adult  Generalized abdominal pain  Painless hematuria    ED Discharge Orders        Ordered    ondansetron (ZOFRAN) 4 MG tablet  Every 8 hours PRN     04/23/17 1459    benzonatate (TESSALON) 100 MG capsule  3 times daily PRN     04/23/17 1459      Jeanie Sewer, PA-C 04/23/17 1533  Phillis Haggis, MD 04/23/17 1535

## 2017-04-23 NOTE — ED Notes (Signed)
Got patient hooked up to the monitor patient is resting with call bell in reach 

## 2017-04-23 NOTE — Discharge Instructions (Signed)
Take Tessalon for cough.  Take Zofran as needed for nausea and vomiting.  Drink plenty of fluids and get plenty of rest.  Eat a diet of bland foods that will not upset your stomach.  Try to cut back and eventually stop alcohol use.  Follow-up with a primary care physician for reevaluation of the blood in your urine.  Return to the emergency department if any concerning signs or symptoms develop such as not being able to keep any food or drink down, fevers, persisting shortness of breath or chest pain, or blood in your stool.

## 2017-04-25 LAB — URINE CULTURE

## 2017-08-14 ENCOUNTER — Emergency Department (HOSPITAL_COMMUNITY)
Admission: EM | Admit: 2017-08-14 | Discharge: 2017-08-14 | Disposition: A | Discharge status: Home / Self Care | Payer: Self-pay | Attending: Emergency Medicine | Admitting: Emergency Medicine

## 2017-08-14 ENCOUNTER — Encounter (HOSPITAL_COMMUNITY): Payer: Self-pay

## 2017-08-14 ENCOUNTER — Other Ambulatory Visit: Payer: Self-pay

## 2017-08-14 DIAGNOSIS — F17210 Nicotine dependence, cigarettes, uncomplicated: Secondary | ICD-10-CM | POA: Insufficient documentation

## 2017-08-14 DIAGNOSIS — S0081XA Abrasion of other part of head, initial encounter: Secondary | ICD-10-CM | POA: Insufficient documentation

## 2017-08-14 DIAGNOSIS — Y998 Other external cause status: Secondary | ICD-10-CM | POA: Insufficient documentation

## 2017-08-14 DIAGNOSIS — T07XXXA Unspecified multiple injuries, initial encounter: Secondary | ICD-10-CM

## 2017-08-14 DIAGNOSIS — F1210 Cannabis abuse, uncomplicated: Secondary | ICD-10-CM | POA: Insufficient documentation

## 2017-08-14 DIAGNOSIS — S40211A Abrasion of right shoulder, initial encounter: Secondary | ICD-10-CM | POA: Insufficient documentation

## 2017-08-14 DIAGNOSIS — S40212A Abrasion of left shoulder, initial encounter: Secondary | ICD-10-CM | POA: Insufficient documentation

## 2017-08-14 DIAGNOSIS — Y929 Unspecified place or not applicable: Secondary | ICD-10-CM | POA: Insufficient documentation

## 2017-08-14 DIAGNOSIS — Y9389 Activity, other specified: Secondary | ICD-10-CM | POA: Insufficient documentation

## 2017-08-14 MED ORDER — TRAMADOL HCL 50 MG PO TABS: 50.0000 mg | ORAL_TABLET | Freq: Four times a day (QID) | ORAL | 0 refills | Status: DC | PRN

## 2017-08-14 MED ORDER — BACITRACIN ZINC 500 UNIT/GM EX OINT
TOPICAL_OINTMENT | CUTANEOUS | Status: AC
  Administered 2017-08-14: 3
  Filled 2017-08-14: qty 2.7

## 2017-08-14 MED ORDER — KETOROLAC TROMETHAMINE 15 MG/ML IJ SOLN
30.0000 mg | Freq: Once | INTRAMUSCULAR | Status: AC
  Administered 2017-08-14: 30 mg via INTRAMUSCULAR
  Filled 2017-08-14: qty 2
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
## 2017-08-14 NOTE — Discharge Instructions (Signed)
Please return for any problem.  °
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
## 2017-11-28 ENCOUNTER — Emergency Department (HOSPITAL_COMMUNITY): Payer: Self-pay

## 2017-11-28 ENCOUNTER — Emergency Department (HOSPITAL_COMMUNITY)
Admission: EM | Admit: 2017-11-28 | Discharge: 2017-11-28 | Disposition: A | Discharge status: Home / Self Care | Payer: Self-pay | Attending: Emergency Medicine | Admitting: Emergency Medicine

## 2017-11-28 ENCOUNTER — Other Ambulatory Visit: Payer: Self-pay

## 2017-11-28 ENCOUNTER — Encounter (HOSPITAL_COMMUNITY): Payer: Self-pay | Admitting: Emergency Medicine

## 2017-11-28 DIAGNOSIS — Z79899 Other long term (current) drug therapy: Secondary | ICD-10-CM | POA: Insufficient documentation

## 2017-11-28 DIAGNOSIS — Y929 Unspecified place or not applicable: Secondary | ICD-10-CM | POA: Insufficient documentation

## 2017-11-28 DIAGNOSIS — Y999 Unspecified external cause status: Secondary | ICD-10-CM | POA: Insufficient documentation

## 2017-11-28 DIAGNOSIS — Y9301 Activity, walking, marching and hiking: Secondary | ICD-10-CM | POA: Insufficient documentation

## 2017-11-28 DIAGNOSIS — F17210 Nicotine dependence, cigarettes, uncomplicated: Secondary | ICD-10-CM | POA: Insufficient documentation

## 2017-11-28 DIAGNOSIS — W25XXXA Contact with sharp glass, initial encounter: Secondary | ICD-10-CM | POA: Insufficient documentation

## 2017-11-28 DIAGNOSIS — Z23 Encounter for immunization: Secondary | ICD-10-CM | POA: Insufficient documentation

## 2017-11-28 DIAGNOSIS — S81812A Laceration without foreign body, left lower leg, initial encounter: Secondary | ICD-10-CM | POA: Insufficient documentation

## 2017-11-28 MED ORDER — NAPROXEN 375 MG PO TABS: 375.0000 mg | ORAL_TABLET | Freq: Two times a day (BID) | ORAL | 0 refills | Status: DC

## 2017-11-28 MED ORDER — LIDOCAINE-EPINEPHRINE (PF) 2 %-1:200000 IJ SOLN
20.0000 mL | Freq: Once | INTRAMUSCULAR | Status: AC
  Administered 2017-11-28: 20 mL
  Filled 2017-11-28: qty 20

## 2017-11-28 MED ORDER — TETANUS-DIPHTH-ACELL PERTUSSIS 5-2.5-18.5 LF-MCG/0.5 IM SUSP
0.5000 mL | Freq: Once | INTRAMUSCULAR | Status: DC
  Filled 2017-11-28: qty 0.5

## 2017-11-28 NOTE — ED Notes (Signed)
Patient verbalizes understanding of discharge instructions. Opportunity for questioning and answers were provided. Pt discharged from ED. 

## 2017-11-28 NOTE — Discharge Instructions (Addendum)
Please use the cam walker boot at all times when walking for the next 10 days. Keep the bandage on your wound for the next 24 hours  WOUND CARE Please have your stitches/staples removed in 10 days or sooner if you have concerns. You may do this at any available urgent care or at your primary care doctor's office.  Keep area clean and dry for 24 hours. Do not remove bandage, if applied unless you are have significant bleeding  After 24 hours, remove bandage and wash wound gently with mild soap and warm water. Reapply a new bandage after cleaning wound, if directed.  Continue daily cleansing with soap and water until stitches/staples are removed.  Do not apply any ointments or creams to the wound while stitches/staples are in place, as this may cause delayed healing.  Seek medical careif you experience any of the following signs of infection: Swelling, redness, pus drainage, streaking, fever >101.0 F  Seek care if you experience excessive bleeding that does not stop after 15-20 minutes of constant, firm pressure.

## 2017-11-28 NOTE — ED Triage Notes (Signed)
APproximately 1 hour ago pt fell through a glass coffee table. Describes a V-shaped laceration to left calf. Bleeding controlled with patient-applied bandage. Pt states it initially didn't hurt but now is hurting and foot feels numb. Pulses intact.

## 2017-11-28 NOTE — ED Provider Notes (Signed)
[facility]ENC[facility] Provider Note   CSN: [id number] Arrival date & time: 11/28/17  0305     History   Chief Complaint Chief Complaint  Patient presents with  . Extremity Laceration    HPI Webb SilversmithMark Saephan is a 32 y.o. male who presents to the ER with CC of laceration. The patient states that she bumped his leg into a glass table that shattered and caused a laceration to the skin about 5 hours prior to my evaluation.  He is up-to-date on his tetanus vaccination from 1 year ago.  He denies any numbness or tingling in the foot and denies any weakness in movement of the ankle or toes.  He has been ambulatory.  HPI  Past Medical History:  Diagnosis Date  . Bennett's fracture of base of metacarpal of right thumb 09/16/2014  . Schizophrenia [facility](HCC)     Patient Active Problem List   Diagnosis Date Noted  . TOBACCO ABUSE 01/22/2010  . 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 DENTAL CARIES 01/22/2010    Past Surgical History:  Procedure Laterality Date  . CLOSED REDUCTION FINGER WITH PERCUTANEOUS PINNING Right 09/26/2014   Procedure: CLOSED REDUCTION FINGER WITH PERCUTANEOUS PINNING RIGHT THUMB BENNETT'S FRACTURE;  Surgeon: Betha LoaKevin Kuzma, MD;  Location: [facility];  Service: Orthopedics;  Laterality: Right;  block with sedation  . NO PAST SURGERIES          Home Medications    Prior to Admission medications   Medication Sig Start Date End Date Taking? Authorizing Provider  benzonatate (TESSALON) 100 MG capsule Take 1 capsule (100 mg total) by mouth 3 (three) times daily as needed for cough. Patient not taking: Reported on 08/14/2017 04/23/17   Michela PitcherFawze, Mina A, PA-C  famotidine (PEPCID) 20 MG tablet Take 1 tablet (20 mg total) 2 (two) times daily by mouth. Patient not taking: Reported on 04/23/2017 02/26/17   Donnetta Hutchingook, Brian, MD  ibuprofen (ADVIL,MOTRIN) 600 MG tablet Take 1 tablet (600 mg total) by mouth every 6 (six) hours as needed for  pain. Patient not taking: Reported on 04/23/2017 08/19/12   Oleh GeninBryant, Casey, MD  OLANZapine (ZYPREXA) 2.5 MG tablet Take 2.5 mg at bedtime by mouth.    [provider]  ondansetron (ZOFRAN) 4 MG tablet Take 1 tablet (4 mg total) by mouth every 8 (eight) hours as needed for nausea or vomiting. Patient not taking: Reported on 08/14/2017 04/23/17   Michela PitcherFawze, Mina A, PA-C  oxyCODONE-acetaminophen (PERCOCET) 5-325 MG per tablet 1-2 tabs po q6 hours prn pain Patient not taking: Reported on 02/26/2017 09/26/14   Betha LoaKuzma, Kevin, MD  traMADol (ULTRAM) 50 MG tablet Take 1 tablet (50 mg total) by mouth every 6 (six) hours as needed. 08/14/17   Wynetta FinesMessick, Peter C, MD    Family History No family history on file.  Social History Social History   Tobacco Use  . Smoking status: Current Every Day Smoker    Packs/day: 0.25    Years: 5.00    Pack years: 1.25    Types: Cigarettes  . Smokeless tobacco: Never Used  . Tobacco comment: 2 cig./day  Substance Use Topics  . Alcohol use: Yes    Comment: weekends  . Drug use: Yes    Types: Marijuana     Allergies   Patient has no known allergies.   Review of Systems Review of Systems  Constitutional: Negative for chills and fever.  Respiratory: Negative for cough and shortness of breath.   Cardiovascular:  Negative for chest pain and palpitations.  Gastrointestinal: Negative for abdominal pain, constipation, diarrhea and vomiting.  Genitourinary: Negative for dysuria, frequency and urgency.  Musculoskeletal: Negative for arthralgias and myalgias.  Skin: Positive for wound. Negative for rash.  Neurological: Negative for weakness, numbness and headaches.  All other systems reviewed and are negative.    Physical Exam Updated Vital Signs BP 122/81   Pulse 86   Temp 98.6 F (37 C) (Oral)   Resp 16   SpO2 100%   Physical Exam  Constitutional: He appears well-developed and well-nourished. No distress.  HENT:  Head: Normocephalic and atraumatic.  Eyes:  Conjunctivae are normal. No scleral icterus.  Neck: Normal range of motion. Neck supple.  Cardiovascular: Normal rate, regular rhythm and normal heart sounds.  Pulmonary/Chest: Effort normal and breath sounds normal. No respiratory distress.  Abdominal: Soft. There is no tenderness.  Musculoskeletal: He exhibits no edema.  V-shaped laceration to the left anterior tibia with extension through the fascial layers. Full range of motion of the left ankle and toes.  Normal sensation and strong DP and PT pulses.  Neurological: He is alert.  Skin: Skin is warm and dry. He is not diaphoretic.  Psychiatric: His behavior is normal.  Nursing note and vitals reviewed.    ED Treatments / Results  Labs (all labs ordered are listed, but only abnormal results are displayed) Labs Reviewed - No data to display  EKG None  Radiology Dg Tibia/fibula Left  Result Date: 11/28/2017 CLINICAL DATA:  Patient fell through glass coffee table 1 hour ago with lacerations to the left mid calf. EXAM: LEFT TIBIA AND FIBULA - 2 VIEW COMPARISON:  None. FINDINGS: Left tibia and fibula appear intact. No evidence of acute fracture or dislocation. Overlying bandage material limits evaluation of soft tissues but no definite radiopaque foreign bodies are demonstrated. IMPRESSION: No acute bony abnormalities. No definite radiopaque foreign bodies. Electronically Signed   By: Burman Nieves M.D.   On: 11/28/2017 05:14    Procedures .Marland KitchenLaceration Repair Date/Time: 11/28/2017 8:58 AM Performed by: Arthor Captain, PA-C Authorized by: Arthor Captain, PA-C   Consent:    Consent obtained:  Verbal   Consent given by:  Patient   Risks discussed:  Infection, need for additional repair, poor cosmetic result and poor wound healing   Alternatives discussed:  No treatment and delayed treatment Anesthesia (see MAR for exact dosages):    Anesthesia method:  Local infiltration   Local anesthetic:  Lidocaine 2% WITH epi Laceration  details:    Location:  Leg   Leg location:  L lower leg   Length (cm):  10   Depth (mm):  10 Repair type:    Repair type:  Complex Pre-procedure details:    Preparation:  Patient was prepped and draped in usual sterile fashion and imaging obtained to evaluate for foreign bodies Exploration:    Hemostasis achieved with:  Epinephrine and direct pressure   Wound exploration: wound explored through full range of motion and entire depth of wound probed and visualized     Wound extent: fascia violated, foreign bodies/material and muscle damage     Wound extent: no tendon damage noted     Foreign bodies/material:  Dirt, debris   Contaminated: yes   Treatment:    Area cleansed with:  Betadine   Amount of cleaning:  Extensive   Irrigation solution: Safe-cleanse.   Irrigation method:  Pressure wash   Debridement:  Moderate Fascia repair:    Suture size:  5-0  Suture material:  Vicryl   Suture technique:  Simple interrupted   Number of sutures:  6 Skin repair:    Repair method:  Sutures   Suture size:  4-0   Suture material:  Prolene   Suture technique:  Simple interrupted and horizontal mattress   Number of sutures:  16 Approximation:    Approximation:  Close Post-procedure details:    Dressing:  Adhesive bandage   Patient tolerance of procedure:  Tolerated well, no immediate complications  .Splint Application Date/Time: 11/28/2017 5:19 PM Performed by: Arthor CaptainHarris, Railey Glad, PA-C Authorized by: Arthor CaptainHarris, Brityn Mastrogiovanni, PA-C   Consent:    Consent obtained:  Verbal   Consent given by:  Patient   Risks discussed:  Discoloration, numbness and pain   Alternatives discussed:  No treatment Pre-procedure details:    Sensation:  Normal Procedure details:    Laterality:  Left   Location:  Leg   Leg:  L lower leg   Cast type:  Short leg walking   Supplies:  Prefabricated splint Post-procedure details:    Pain:  Improved   Sensation:  Normal   Patient tolerance of procedure:  Tolerated well,  no immediate complications   (including critical care time)  Medications Ordered in ED Medications  lidocaine-EPINEPHrine (XYLOCAINE W/EPI) 2 %-1:200000 (PF) injection 20 mL (20 mLs Infiltration Given by Other 11/28/17 0809)     Initial Impression / Assessment and Plan / ED Course  I have reviewed the triage vital signs and the nursing notes.  Pertinent labs & imaging results that were available during my care of the patient were reviewed by me and considered in my medical decision making (see chart for details).     With complicated laceration.  He is up-to-date on his tetanus vaccination.  Spent about 45 minutes aligning this tissue which was under significant tension.  I warned the patient that with the tightness there is a potential for necrosis and should be very aware of observing the area and returning for any concerns.  Patient placed in a cam walker to keep his ankle still so as not to put any more tension on the wound.  He is advised to follow-up in 10 days for suture removal and he appears appropriate for discharge at this time  Final Clinical Impressions(s) / ED Diagnoses   Final diagnoses:  None    ED Discharge Orders    None       Arthor CaptainHarris, Kahdijah Errickson, PA-C 11/28/17 1721    Little, Ambrose Finlandachel Morgan, MD 12/01/17 [phone]

## 2017-12-11 ENCOUNTER — Encounter (HOSPITAL_COMMUNITY): Payer: Self-pay

## 2017-12-11 ENCOUNTER — Other Ambulatory Visit: Payer: Self-pay

## 2017-12-11 ENCOUNTER — Emergency Department (HOSPITAL_COMMUNITY)
Admission: EM | Admit: 2017-12-11 | Discharge: 2017-12-11 | Disposition: A | Discharge status: Home / Self Care | Payer: Self-pay | Attending: Emergency Medicine | Admitting: Emergency Medicine

## 2017-12-11 DIAGNOSIS — S81812D Laceration without foreign body, left lower leg, subsequent encounter: Secondary | ICD-10-CM | POA: Insufficient documentation

## 2017-12-11 DIAGNOSIS — Z87891 Personal history of nicotine dependence: Secondary | ICD-10-CM | POA: Insufficient documentation

## 2017-12-11 DIAGNOSIS — L089 Local infection of the skin and subcutaneous tissue, unspecified: Secondary | ICD-10-CM | POA: Insufficient documentation

## 2017-12-11 DIAGNOSIS — Z79899 Other long term (current) drug therapy: Secondary | ICD-10-CM | POA: Insufficient documentation

## 2017-12-11 DIAGNOSIS — T148XXA Other injury of unspecified body region, initial encounter: Secondary | ICD-10-CM

## 2017-12-11 DIAGNOSIS — X58XXXA Exposure to other specified factors, initial encounter: Secondary | ICD-10-CM | POA: Insufficient documentation

## 2017-12-11 DIAGNOSIS — Z4802 Encounter for removal of sutures: Secondary | ICD-10-CM

## 2017-12-11 MED ORDER — BACITRACIN ZINC 500 UNIT/GM EX OINT
TOPICAL_OINTMENT | Freq: Once | CUTANEOUS | Status: AC
  Administered 2017-12-11: 1 via TOPICAL

## 2017-12-11 MED ORDER — DOXYCYCLINE HYCLATE 100 MG PO CAPS: 100.0000 mg | ORAL_CAPSULE | Freq: Two times a day (BID) | ORAL | 0 refills | Status: DC

## 2017-12-11 MED ORDER — CEPHALEXIN 500 MG PO CAPS: 500.0000 mg | ORAL_CAPSULE | Freq: Three times a day (TID) | ORAL | 0 refills | Status: DC

## 2017-12-11 NOTE — ED Triage Notes (Signed)
Patient is requesting left lower leg sutures to be removed.

## 2017-12-11 NOTE — ED Provider Notes (Signed)
[facility]-EMERGENCY DEPT Provider Note   CSN: [id number] Arrival date & time: 12/11/17  1054     History   Chief Complaint Chief Complaint  Patient presents with  . Suture / Staple Removal    HPI Mike Thompson is a 32 y.o. male with history of schizophrenia who presents to the emergency department for suture removal today. Patient seen in the emergency department 08/16 for a laceration to his left lower leg-Per chart review patient had a V-shaped laceration which required multilevel closure.  Patient had 6 fascial layer absorbable sutures placed with subsequent 16 sutures placed in skin. Advised for 10 day follow up, given a boot.  Patient was supposed to have sutures out in 10 days (3 days ago).  He states that up until a few days ago he was doing generally well, however he then started to notice some purulent drainage from the area as well as some blood.  Area is somewhat uncomfortable at night.  Takes Motrin with relief.  No other specific alleviating or aggravating factors.  Denies fever, surrounding redness, numbness, paresthesias, or weakness.  HPI  Past Medical History:  Diagnosis Date  . Bennett's fracture of base of metacarpal of right thumb 09/16/2014  . Schizophrenia [facility](HCC)     Patient Active Problem List   Diagnosis Date Noted  . TOBACCO ABUSE 01/22/2010  . 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 DENTAL CARIES 01/22/2010    Past Surgical History:  Procedure Laterality Date  . CLOSED REDUCTION FINGER WITH PERCUTANEOUS PINNING Right 09/26/2014   Procedure: CLOSED REDUCTION FINGER WITH PERCUTANEOUS PINNING RIGHT THUMB BENNETT'S FRACTURE;  Surgeon: Betha LoaKevin Kuzma, MD;  Location:  [facility];  Service: Orthopedics;  Laterality: Right;  block with sedation  . NO PAST SURGERIES          Home Medications    Prior to Admission medications   Medication Sig Start Date End Date Taking? Authorizing Provider  benzonatate  (TESSALON) 100 MG capsule Take 1 capsule (100 mg total) by mouth 3 (three) times daily as needed for cough. Patient not taking: Reported on 08/14/2017 04/23/17   Michela PitcherFawze, Mina A, PA-C  famotidine (PEPCID) 20 MG tablet Take 1 tablet (20 mg total) 2 (two) times daily by mouth. Patient not taking: Reported on 04/23/2017 02/26/17   Donnetta Hutchingook, Brian, MD  ibuprofen (ADVIL,MOTRIN) 600 MG tablet Take 1 tablet (600 mg total) by mouth every 6 (six) hours as needed for pain. Patient not taking: Reported on 04/23/2017 08/19/12   Oleh GeninBryant, Casey, MD  naproxen (NAPROSYN) 375 MG tablet Take 1 tablet (375 mg total) by mouth 2 (two) times daily. 11/28/17   Harris, Abigail, PA-C  OLANZapine (ZYPREXA) 2.5 MG tablet Take 2.5 mg at bedtime by mouth.    [provider]  ondansetron (ZOFRAN) 4 MG tablet Take 1 tablet (4 mg total) by mouth every 8 (eight) hours as needed for nausea or vomiting. Patient not taking: Reported on 08/14/2017 04/23/17   Michela PitcherFawze, Mina A, PA-C  oxyCODONE-acetaminophen (PERCOCET) 5-325 MG per tablet 1-2 tabs po q6 hours prn pain Patient not taking: Reported on 02/26/2017 09/26/14   Betha LoaKuzma, Kevin, MD  traMADol (ULTRAM) 50 MG tablet Take 1 tablet (50 mg total) by mouth every 6 (six) hours as needed. 08/14/17   Wynetta FinesMessick, Peter C, MD    Family History History reviewed. No pertinent family history.  Social History Social History   Tobacco Use  . Smoking status: Former Smoker    Packs/day: 0.25  Years: 5.00    Pack years: 1.25    Types: Cigarettes  . Smokeless tobacco: Never Used  . Tobacco comment: 2 cig./day  Substance Use Topics  . Alcohol use: Yes    Comment: weekends  . Drug use: Yes    Types: Marijuana     Allergies   Patient has no known allergies.   Review of Systems Review of Systems  Constitutional: Negative for fever.  Skin: Positive for wound. Negative for color change.  Neurological: Negative for weakness and numbness.     Physical Exam Updated Vital Signs BP 134/87   Pulse 69    Temp 98.1 F (36.7 C) (Oral)   Resp 16   Ht 5\' 7"  (1.702 m)   Wt 70.3 kg   SpO2 100%   BMI 24.28 kg/m   Physical Exam  Constitutional: He appears well-developed and well-nourished. No distress.  HENT:  Head: Normocephalic and atraumatic.  Eyes: Conjunctivae are normal. Right eye exhibits no discharge. Left eye exhibits no discharge.  Cardiovascular: Normal rate and regular rhythm.  2+ symmetric DP and PT pulses.  Pulmonary/Chest: Effort normal and breath sounds normal.  Musculoskeletal:  Lower extremities: Normal range of motion to bilateral knees, ankles, and all digits.  No palpable bony tenderness.  Neurological: He is alert.  Clear speech.  Sensation grossly intact bilateral lower extremities.  5 out of 5 strength with plantar dorsiflexion bilaterally.  Ambulatory  Skin:  Patient has 10 cm V-shaped healing laceration to the distal anterior lower leg on the left with 16 sutures in place.  Dressing removed and there does appear to be some purulent drainage on his dressing.  Wound appears well-healed to lateral end, however central/medial area appears to not be completely completely closed with some separation of the skin upon palpation.  There is some purulent drainage as well as granulation tissue. Mild soft tissue swelling. Mild tenderness to palpation.  There is no palpable fluctuance or indurations.  No significant surrounding erythema or streaking erythema appearance.  Area is not warm to the touch.  Psychiatric: He has a normal mood and affect. His behavior is normal. Thought content normal.  Nursing note and vitals reviewed.      ED Treatments / Results  Labs (all labs ordered are listed, but only abnormal results are displayed) Labs Reviewed - No data to display  EKG None  Radiology No results found.  Procedures .Suture Removal Date/Time: 12/11/2017 12:56 PM Performed by: Cherly Anderson, PA-C Authorized by: Cherly Anderson, PA-C   Consent:     Consent obtained:  Verbal   Consent given by:  Patient   Risks discussed:  Bleeding, pain and wound separation   Alternatives discussed:  No treatment and alternative treatment Location:    Location:  Lower extremity   Lower extremity location:  Leg   Leg location:  L lower leg Procedure details:    Wound appearance:  Draining, purulent and tender   Number of sutures removed:  16 Post-procedure details:    Post-removal:  Steri-Strips applied, antibiotic ointment applied and dressing applied   Patient tolerance of procedure:  Tolerated well, no immediate complications   (including critical care time)  Medications Ordered in ED Medications - No data to display   Initial Impression / Assessment and Plan / ED Course  I have reviewed the triage vital signs and the nursing notes.  Pertinent labs & imaging results that were available during my care of the patient were reviewed by me and considered  in my medical decision making (see chart for details).   Patient returns to the emergency department for suture removal after placement 13 days prior, was instructed to return in 10 days for suture removal.  Wound appears infected with mild amount of purulent drainage.  No palpable  fluctuance to raise concern for abscess.  Sutures removed per procedure note above. Cleaned with alcohol swab and subsequent betadine. Steri-Strips and antibiotic ointment applied.  No significant wound separation occurred. NVI distally.  Will place patient on antibiotics (Keflex/doxycycline).  Wound recheck in 2 to 3 days. I discussed treatment plan, need for follow-up, and return precautions with the patient. Provided opportunity for questions, patient confirmed understanding and is in agreement with plan.   Findings and plan of care discussed with supervising physician Dr. Freida Busman who is in agreement.    Final Clinical Impressions(s) / ED Diagnoses   Final diagnoses:  Visit for suture removal  Wound infection     ED Discharge Orders         Ordered    doxycycline (VIBRAMYCIN) 100 MG capsule  2 times daily     12/11/17 1254    cephALEXin (KEFLEX) 500 MG capsule  3 times daily     12/11/17 1254           Shanine Kreiger, Pleas Koch, PA-C 12/11/17 2209    Lorre Nick, MD 12/12/17 1704

## 2017-12-11 NOTE — ED Notes (Signed)
Bed: WTR8 Expected date:  Expected time:  Means of arrival:  Comments: 

## 2017-12-11 NOTE — Discharge Instructions (Addendum)
You were seen in the emergency department today to have your stitches removed.  Your stitches were successfully removed.  We are concerned that the wound that you have is infected.  We are placing you on antibiotics for this infection-doxycycline and Keflex. Please take all of your antibiotics until finished. You may develop abdominal discomfort or diarrhea from the antibiotic.  You may help offset this with probiotics which you can buy at the store (ask your pharmacist if unable to find) or get probiotics in the form of eating yogurt. Do not eat or take the probiotics until 2 hours after your antibiotic. If you are unable to tolerate these side effects follow-up with your primary care provider or return to the emergency department.   If you begin to experience any blistering, rashes, swelling, or difficulty breathing seek medical care for evaluation of potentially more serious side effects.   Please be aware that this medication may interact with other medications you are taking, please be sure to discuss your medication list with your pharmacist.   Continue to take Motrin/Tylenol per over-the-counter dosing instructions.  Please return to the ER for a wound recheck in 2 to 3 days.  You may also go to an urgent care or see your primary care provider for this wound recheck as well.  Return to the ER sooner for new or worsening symptoms including but not limited to worsening pain, increased pus drainage, redness around the wound, fever, or any other concerns.

## 2019-01-03 ENCOUNTER — Emergency Department (HOSPITAL_COMMUNITY)
Admission: EM | Admit: 2019-01-03 | Discharge: 2019-01-03 | Disposition: A | Discharge status: Home / Self Care | Payer: Self-pay | Attending: Emergency Medicine | Admitting: Emergency Medicine

## 2019-01-03 ENCOUNTER — Encounter (HOSPITAL_COMMUNITY): Payer: Self-pay | Admitting: Emergency Medicine

## 2019-01-03 ENCOUNTER — Emergency Department (HOSPITAL_COMMUNITY): Payer: Self-pay

## 2019-01-03 ENCOUNTER — Other Ambulatory Visit: Payer: Self-pay

## 2019-01-03 DIAGNOSIS — W2209XA Striking against other stationary object, initial encounter: Secondary | ICD-10-CM | POA: Insufficient documentation

## 2019-01-03 DIAGNOSIS — S60221A Contusion of right hand, initial encounter: Secondary | ICD-10-CM | POA: Insufficient documentation

## 2019-01-03 DIAGNOSIS — Z87891 Personal history of nicotine dependence: Secondary | ICD-10-CM | POA: Insufficient documentation

## 2019-01-03 DIAGNOSIS — Y939 Activity, unspecified: Secondary | ICD-10-CM | POA: Insufficient documentation

## 2019-01-03 DIAGNOSIS — Y999 Unspecified external cause status: Secondary | ICD-10-CM | POA: Insufficient documentation

## 2019-01-03 DIAGNOSIS — Z79899 Other long term (current) drug therapy: Secondary | ICD-10-CM | POA: Insufficient documentation

## 2019-01-03 DIAGNOSIS — Y929 Unspecified place or not applicable: Secondary | ICD-10-CM | POA: Insufficient documentation

## 2019-01-03 NOTE — ED Provider Notes (Signed)
[facility]-EMERGENCY DEPT Provider Note   CSN: [id number] Arrival date & time: 01/03/19  1825     History   Chief Complaint Chief Complaint  Patient presents with   Hand Injury    HPI Mike SilversmithMark Thompson is a 33 y.o. male who presents emergency department with chief complaint of right hand injury.  Patient states that 3 days ago he punched a wall.  He states that at that time 1 of the bones "popped up in my hand.  He points to the carpometacarpal region.  He states that he popped it back in himself.  Since that time he has had pain and swelling over the dorsum of the hand.  He is able to move his wrist and fingers but states that he has pain when he tries to "pick up my daughter."  He denies any numbness or tingling.  He is right-hand dominant.     HPI  Past Medical History:  Diagnosis Date   Bennett's fracture of base of metacarpal of right thumb 09/16/2014   Schizophrenia [facility](HCC)     Patient Active Problem List   Diagnosis Date Noted   TOBACCO ABUSE 01/22/2010   OTHER CHANGE OF LACRIMAL PASSAGES 01/22/2010   DENTAL CARIES 01/22/2010    Past Surgical History:  Procedure Laterality Date   CLOSED REDUCTION FINGER WITH PERCUTANEOUS PINNING Right 09/26/2014   Procedure: CLOSED REDUCTION FINGER WITH PERCUTANEOUS PINNING RIGHT THUMB BENNETT'S FRACTURE;  Surgeon: Betha LoaKevin Kuzma, MD;  Location: [facility];  Service: Orthopedics;  Laterality: Right;  block with sedation   NO PAST SURGERIES          Home Medications    Prior to Admission medications   Medication Sig Start Date End Date Taking? Authorizing Provider  cephALEXin (KEFLEX) 500 MG capsule Take 1 capsule (500 mg total) by mouth 3 (three) times daily. 12/11/17   Petrucelli, Samantha R, PA-C  doxycycline (VIBRAMYCIN) 100 MG capsule Take 1 capsule (100 mg total) by mouth 2 (two) times daily. 12/11/17   Petrucelli, Samantha R, PA-C  naproxen (NAPROSYN) 375 MG tablet Take 1 tablet (375 mg  total) by mouth 2 (two) times daily. 11/28/17   Elchanan Bob, PA-C  OLANZapine (ZYPREXA) 2.5 MG tablet Take 2.5 mg at bedtime by mouth.    [provider]  traMADol (ULTRAM) 50 MG tablet Take 1 tablet (50 mg total) by mouth every 6 (six) hours as needed. 08/14/17   Wynetta FinesMessick, Peter C, MD    Family History No family history on file.  Social History Social History   Tobacco Use   Smoking status: Former Smoker    Packs/day: 0.25    Years: 5.00    Pack years: 1.25    Types: Cigarettes   Smokeless tobacco: Never Used   Tobacco comment: 2 cig./day  Substance Use Topics   Alcohol use: Yes    Comment: weekends   Drug use: Yes    Types: Marijuana     Allergies   Patient has no known allergies.   Review of Systems Review of Systems  Ten systems reviewed and are negative for acute change, except as noted in the HPI.   Physical Exam Updated Vital Signs BP 119/74    Pulse 95    Temp 98.2 F (36.8 C)    Resp 19    SpO2 97%   Physical Exam Vitals signs and nursing note reviewed.  Constitutional:      General: He is not in acute distress.  Appearance: He is well-developed. He is not diaphoretic.  HENT:     Head: Normocephalic and atraumatic.  Eyes:     General: No scleral icterus.    Conjunctiva/sclera: Conjunctivae normal.  Neck:     Musculoskeletal: Normal range of motion and neck supple.  Cardiovascular:     Rate and Rhythm: Normal rate and regular rhythm.     Heart sounds: Normal heart sounds.  Pulmonary:     Effort: Pulmonary effort is normal. No respiratory distress.     Breath sounds: Normal breath sounds.  Abdominal:     Palpations: Abdomen is soft.     Tenderness: There is no abdominal tenderness.  Musculoskeletal:        General: Swelling and tenderness present. No deformity.     Comments: Right hand with swelling over the dorsum.  No obvious deformity, able to wiggle fingers, normal sensation, full range of motion of the wrist.  Tender over the  dorsum of the hand.  Skin:    General: Skin is warm and dry.  Neurological:     Mental Status: He is alert.  Psychiatric:        Behavior: Behavior normal.      ED Treatments / Results  Labs (all labs ordered are listed, but only abnormal results are displayed) Labs Reviewed - No data to display  EKG None  Radiology Dg Hand Complete Right  Result Date: 01/03/2019 CLINICAL DATA:  Punched wall, pain and swelling EXAM: RIGHT HAND - COMPLETE 3+ VIEW COMPARISON:  10/11/2015 FINDINGS: No fracture or dislocation of the right hand. 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 Joint spaces are otherwise well preserved. Soft tissues are unremarkable. IMPRESSION: No fracture or dislocation of the right hand. Electronically Signed   By: Eddie Candle M.D.   On: 01/03/2019 18:56    Procedures Procedures (including critical care time)  Medications Ordered in ED Medications - No data to display   Initial Impression / Assessment and Plan / ED Course  I have reviewed the triage vital signs and the nursing notes.  Pertinent labs & imaging results that were available during my care of the patient were reviewed by me and considered in my medical decision making (see chart for details).        Patient here with right wrist injury.  Sounds like he had a carpometacarpal dislocation that he reduced himself. I personally reviewed the Plain films and see no obvious, frx, dislocations or obvious ligamentous injuries.  Patient placed in velcro wrist splint. The patient will be discharged home to follow up with OP ortho.   Final Clinical Impressions(s) / ED Diagnoses   Final diagnoses:  Contusion of right hand, initial encounter    ED Discharge Orders    None       Margarita Mail, PA-C 01/04/19 1536    Daleen Bo, MD 01/05/19 1346

## 2019-01-03 NOTE — ED Triage Notes (Signed)
Patient reports punching a wall x3 nights ago. C/o pain and swelling to right hand.

## 2019-01-03 NOTE — Discharge Instructions (Signed)
Return to the emergency department if he has any new, worsening symptoms including severe pain, redness, heat, signs of infection, fever or chills numbness or inability to use the right hand.

## 2019-01-14 DIAGNOSIS — F250 Schizoaffective disorder, bipolar type: Secondary | ICD-10-CM | POA: Diagnosis present

## 2019-01-14 DIAGNOSIS — F1020 Alcohol dependence, uncomplicated: Secondary | ICD-10-CM | POA: Diagnosis present

## 2019-01-14 DIAGNOSIS — F1220 Cannabis dependence, uncomplicated: Secondary | ICD-10-CM | POA: Diagnosis present

## 2020-03-28 IMAGING — CR DG HAND COMPLETE 3+V*R*
3 series · 3 of 3 positions shown · non-contrast
Comparison: 10/11/2015

CLINICAL DATA: Punched wall, pain and swelling

EXAM:
RIGHT HAND - COMPLETE 3+ VIEW

[x hand pa right]
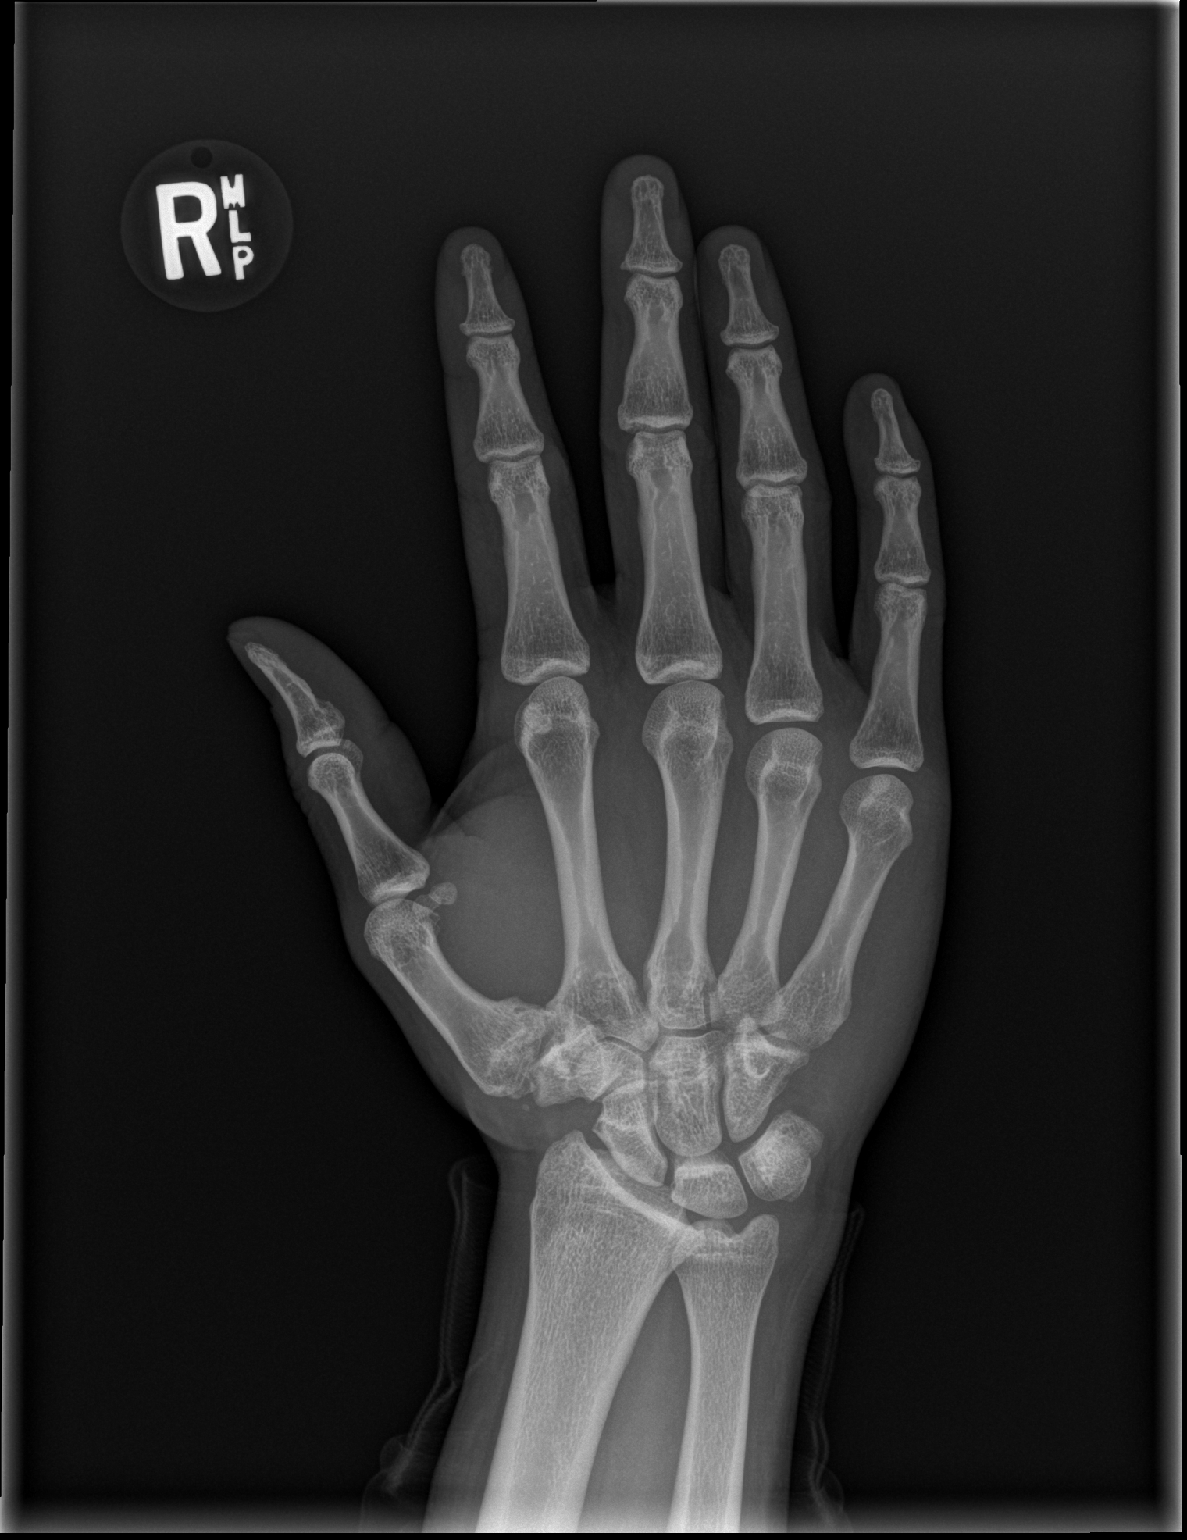

[x hand obl right]
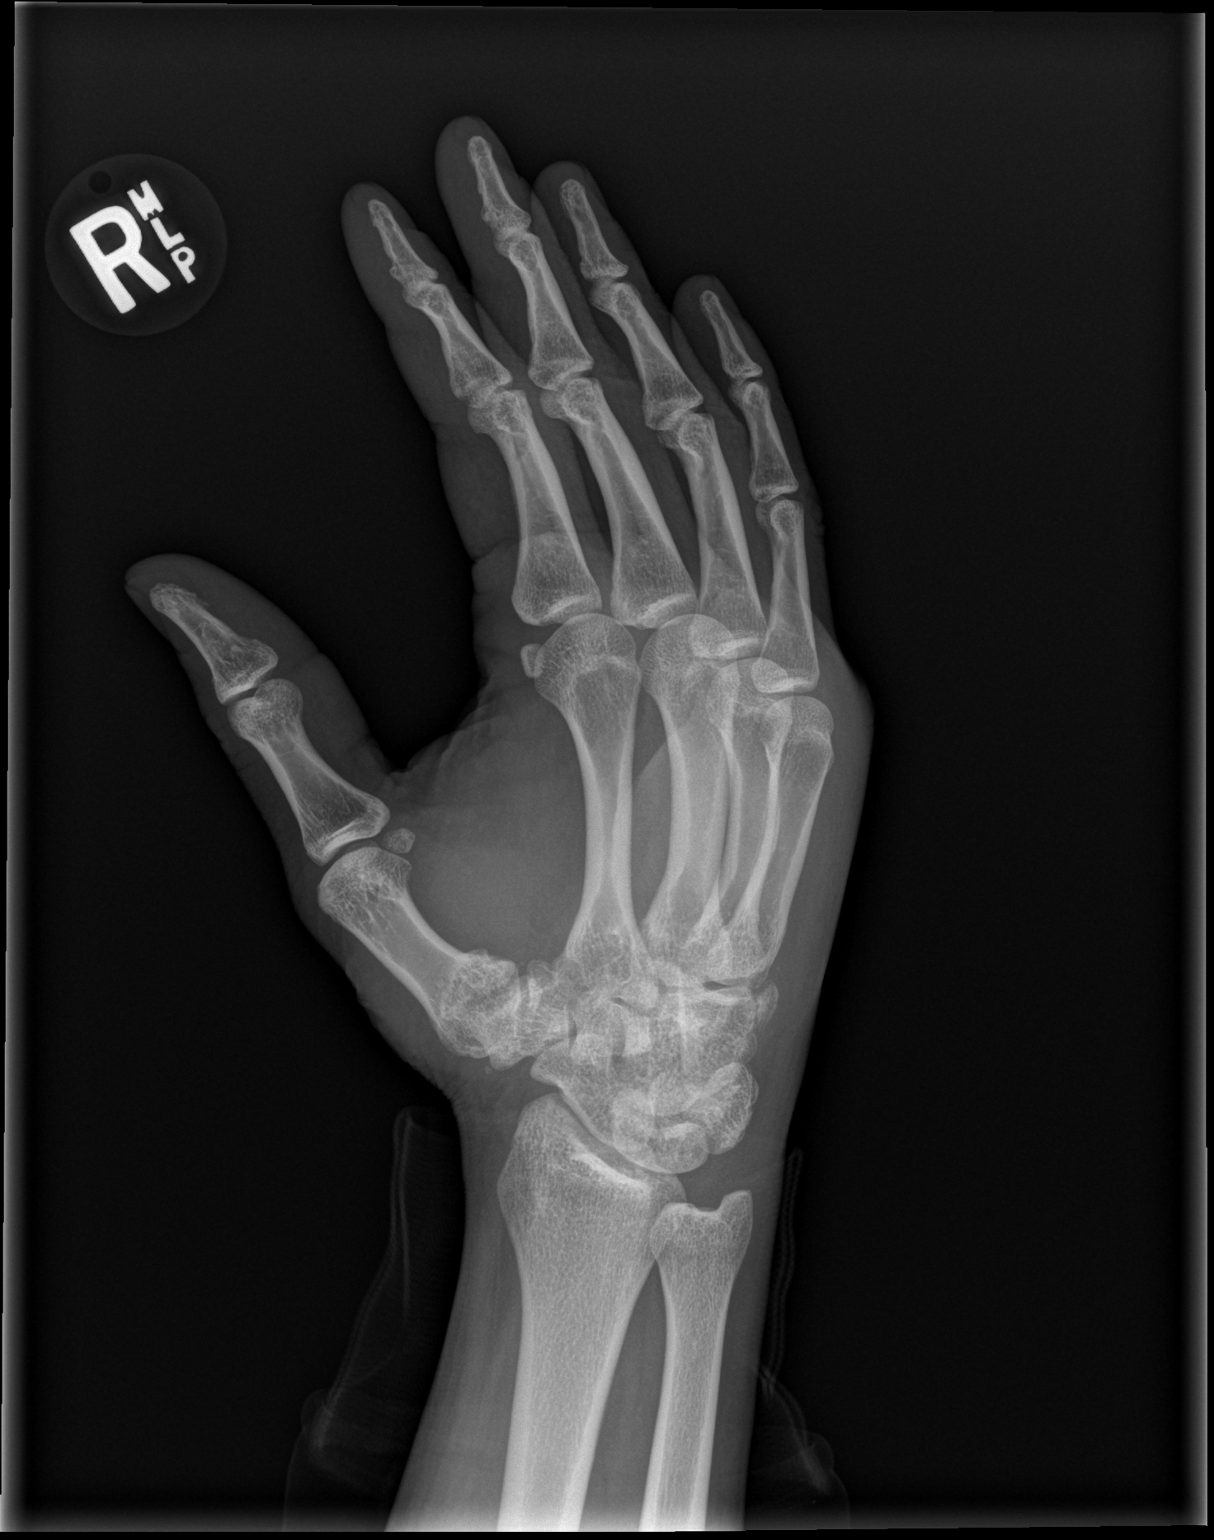

[x hand lat right]
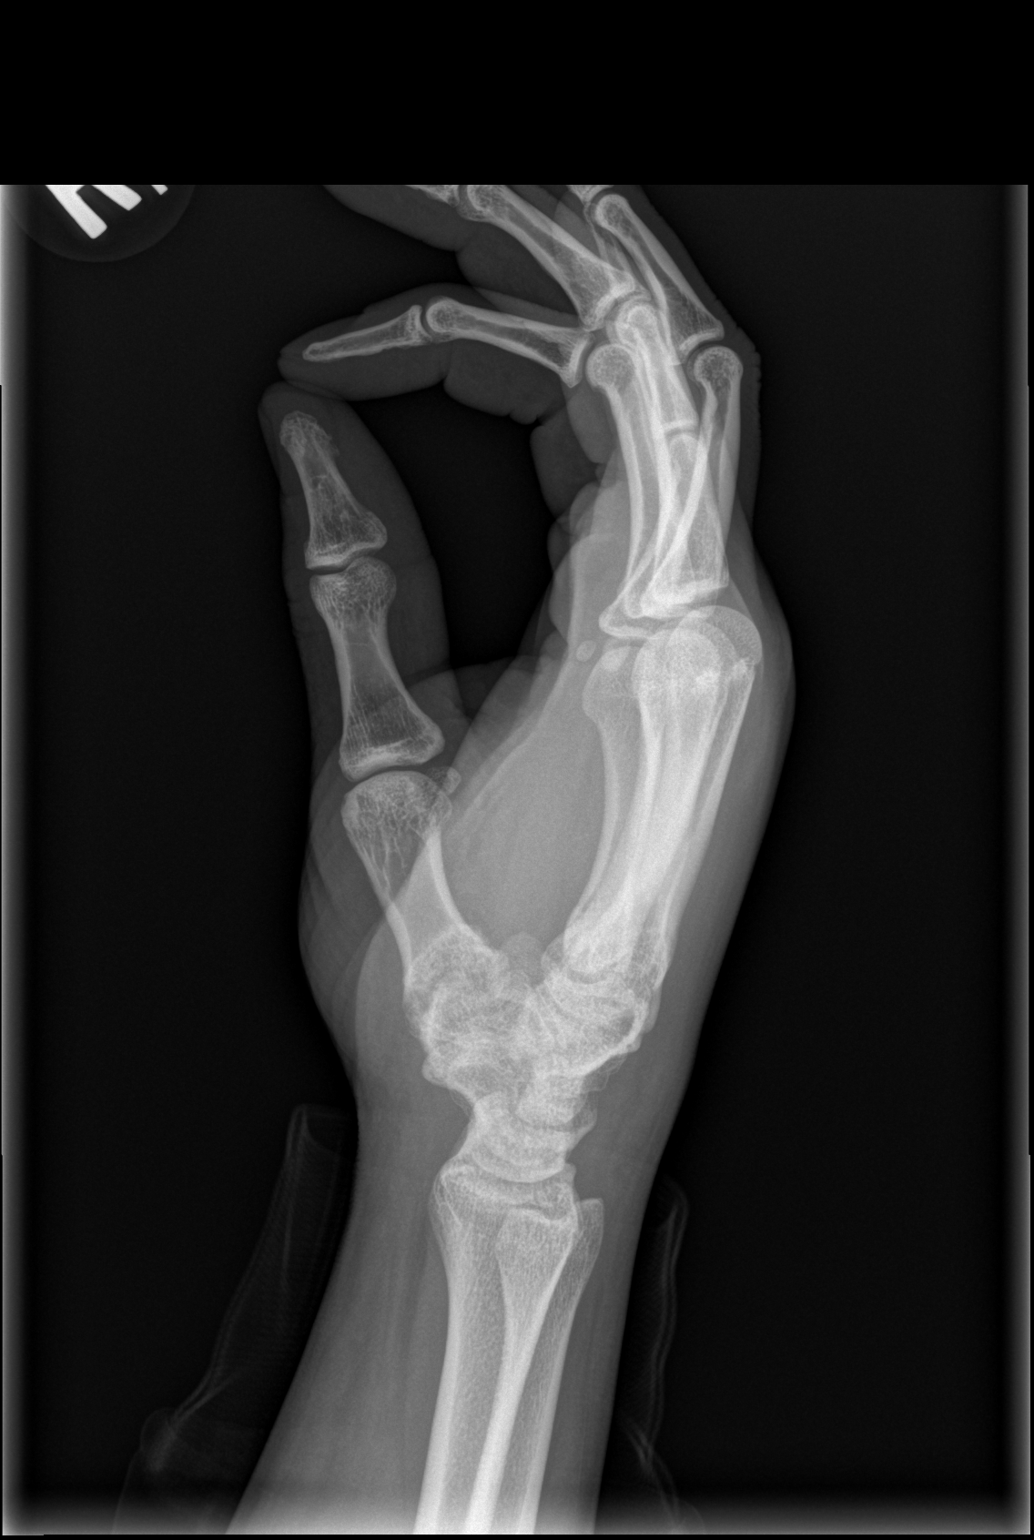

[3 of 3 positions shown; findings below may reference images not displayed]

FINDINGS: No fracture or dislocation of the right hand. There is moderate
thumb basal arthrosis, advanced for patient age. Joint spaces are
otherwise well preserved. Soft tissues are unremarkable.
IMPRESSION: No fracture or dislocation of the right hand.

## 2021-04-22 ENCOUNTER — Ambulatory Visit (HOSPITAL_COMMUNITY)
Admission: EM | Admit: 2021-04-22 | Discharge: 2021-04-23 | Disposition: A | Discharge status: Other Acute Inpatient Hospital | Payer: 59 | Attending: Urology | Admitting: Urology

## 2021-04-22 DIAGNOSIS — F250 Schizoaffective disorder, bipolar type: Secondary | ICD-10-CM | POA: Insufficient documentation

## 2021-04-22 DIAGNOSIS — R45851 Suicidal ideations: Secondary | ICD-10-CM | POA: Diagnosis present

## 2021-04-22 DIAGNOSIS — Z20822 Contact with and (suspected) exposure to covid-19: Secondary | ICD-10-CM | POA: Insufficient documentation

## 2021-04-22 DIAGNOSIS — F1290 Cannabis use, unspecified, uncomplicated: Secondary | ICD-10-CM | POA: Insufficient documentation

## 2021-04-22 DIAGNOSIS — Z87891 Personal history of nicotine dependence: Secondary | ICD-10-CM | POA: Insufficient documentation

## 2021-04-22 DIAGNOSIS — R45850 Homicidal ideations: Secondary | ICD-10-CM | POA: Insufficient documentation

## 2021-04-22 MED ORDER — MAGNESIUM HYDROXIDE 400 MG/5ML PO SUSP: 30.0000 mL | Freq: Every day | ORAL | Status: DC | PRN

## 2021-04-22 MED ORDER — HYDROXYZINE HCL 25 MG PO TABS: 25.0000 mg | ORAL_TABLET | Freq: Three times a day (TID) | ORAL | Status: DC | PRN

## 2021-04-22 MED ORDER — OLANZAPINE 5 MG PO TABS
5.0000 mg | ORAL_TABLET | Freq: Every day | ORAL | Status: DC
  Administered 2021-04-23: 5 mg via ORAL
  Filled 2021-04-22: qty 1

## 2021-04-22 MED ORDER — TRAZODONE HCL 50 MG PO TABS: 50.0000 mg | ORAL_TABLET | Freq: Every evening | ORAL | Status: DC | PRN

## 2021-04-22 MED ORDER — ACETAMINOPHEN 325 MG PO TABS: 650.0000 mg | ORAL_TABLET | Freq: Four times a day (QID) | ORAL | Status: DC | PRN

## 2021-04-22 MED ORDER — ALUM & MAG HYDROXIDE-SIMETH 200-200-20 MG/5ML PO SUSP: 30.0000 mL | ORAL | Status: DC | PRN

## 2021-04-22 NOTE — ED Provider Notes (Signed)
Behavioral Health Admission H&P [facility] & OBS)  Date: 04/22/21 Patient Name: Mike Thompson MRN: [id number] Chief Complaint: No chief complaint on file.     Diagnoses:  Final diagnoses:  Schizoaffective disorder, bipolar type ([place])    HPI: Mike Thompson is a 36 year old male with psychiatric history of schizoaffective disorder, bipolar type.  Patient presented voluntarily to [facility] as a awalk-in.  Patient presented with chief complaint of suicidal and homicidal ideation.  Patient was seen face-to-face and his chart was reviewed by this nurse practitioner. On evaluation, patient is sitting in assessment room with his head on the table. Patient did not appear to be in any distress.  He is alert and oriented x4. Patient reports that he was experiencing suicidal and homicidal ideation prior to this assessment.  Patient states " I want the cops to kill me."  He denies current suicidal ideation but continues to  endorse homicidal ideation . He denies plan/intent to commit homicide and reports no particular target for homicidal ideation.   Patient refused to answer if he is experiencing any depressive symptoms.   Patient reported that he is diagnosed with bipolar; patient states that he is prescribed Zyprexa but has been off of his medication for over 2 years.  Patient admits to abusing Percocet, 2-3 tablets per day.  He reports that he smokes 3-4 joints of marijuana per day.  Patient reports that he drinks 2 to 5 cans of 16 ounce cans of beer per day.  He denies history of alcohol withdrawal symptoms.    *Pt consents for staff to speak to his brother Mike Thompson, [phone] and Essie Christine, cousin, [phone]). Per cousin, the pt called him today crying saying he wanted to kill himself. Pt's cousin reports, the pt is hanging out in the street wanting someone to kill him. Per brother, his mother told him the pt text her saying he wanted to be killed by cops and he took pills. Pt's  cousin reports, the pt wants to hurt anyone. Pt brother reports, the pt has a drinking problem; last night he went missing and did not come home until this evening. Pt's family reports, they do not feel the pt will be safe if discharged.*  PHQ 2-9:     Total Time spent with patient: 20 minutes  Musculoskeletal  Strength & Muscle Tone: within normal limits Gait & Station: normal Patient leans: Right  Psychiatric Specialty Exam  Presentation General Appearance: Casual  Eye Contact:Minimal  Speech:Clear and Coherent  Speech Volume:Normal  Handedness:Right   Mood and Affect  Mood:Irritable  Affect:Blunt   Thought Process  Thought Processes:Coherent  Descriptions of Associations:Intact  Orientation:Full (Time, Place and Person)  Thought Content:WDL   Duration of Psychotic Symptoms: Greater than six months  Hallucinations:Hallucinations: Visual Description of Visual Hallucinations: faces of injured people"  Ideas of Reference:None  Suicidal Thoughts:Suicidal Thoughts: Yes, Active ("wants to be killed by cops.") SI Active Intent and/or Plan: With Plan  Homicidal Thoughts:Homicidal Thoughts: Yes, Passive   Sensorium  Memory:Immediate Good; Recent Fair; Remote Fair  Judgment:Poor  Insight:Poor   Executive Functions  Concentration:Fair  Attention Span:Fair  [place]   Psychomotor Activity  Psychomotor Activity:Psychomotor Activity: Normal   Assets  Assets:Desire for Improvement; Social Support; Physical Health   Sleep  Sleep:Sleep: Poor Number of Hours of Sleep: 0 (pt says he has not slept in 3 days)   Nutritional Assessment (For OBS and [facility]BC admissions only) Has the patient had a weight loss or  gain of 10 pounds or more in the last 3 months?: No Has the patient had a decrease in food intake/or appetite?: No Does the patient have dental problems?: No Does the patient have eating habits or behaviors  that may be indicators of an eating disorder including binging or inducing vomiting?: No Has the patient recently lost weight without trying?: 0 Has the patient been eating poorly because of a decreased appetite?: 0 Malnutrition Screening Tool Score: 0    Physical Exam Vitals and nursing note reviewed.  Constitutional:      General: He is not in acute distress.    Appearance: He is well-developed.  HENT:     Head: Normocephalic and atraumatic.  Eyes:     Conjunctiva/sclera: Conjunctivae normal.  Cardiovascular:     Rate and Rhythm: Normal rate.  Pulmonary:     Effort: Pulmonary effort is normal. No respiratory distress.  Abdominal:     Palpations: Abdomen is soft.     Tenderness: There is no abdominal tenderness.  Musculoskeletal:        General: No swelling.     Cervical back: Neck supple.  Skin:    General: Skin is warm and dry.     Capillary Refill: Capillary refill takes less than 2 seconds.  Neurological:     Mental Status: He is alert and oriented to person, place, and time.  Psychiatric:        Attention and Perception: He perceives visual hallucinations.        Mood and Affect: Mood is depressed. Affect is blunt.        Speech: Speech normal.        Behavior: Behavior is cooperative.        Thought Content: Thought content includes homicidal and suicidal ideation.   Review of Systems  Constitutional: Negative.   HENT: Negative.    Eyes: Negative.   Respiratory: Negative.    Cardiovascular: Negative.   Gastrointestinal: Negative.   Genitourinary: Negative.   Musculoskeletal: Negative.   Skin: Negative.   Neurological: Negative.   Endo/Heme/Allergies: Negative.   Psychiatric/Behavioral:  Positive for depression, hallucinations, substance abuse and suicidal ideas. The patient is nervous/anxious.    Blood pressure (!) 138/96, pulse (!) 107, temperature 98.6 F (37 C), temperature source Oral, resp. rate 18, SpO2 97 %. There is no height or weight on file to  calculate BMI.  Past Psychiatric History: Schizoaffective disorder, bipolar type  Is the patient at risk to self?  Yes Has the patient been a risk to self in the past 6 months?  No.    Has the patient been a risk to self within the distant past? No   Is the patient a risk to others? Yes   Has the patient been a risk to others in the past 6 months? No   Has the patient been a risk to others within the distant past? No   Past Medical History:  Past Medical History:  Diagnosis Date   Bennett's fracture of base of metacarpal of right thumb 09/16/2014   Schizophrenia ([place])     Past Surgical History:  Procedure Laterality Date   CLOSED REDUCTION FINGER WITH PERCUTANEOUS PINNING Right 09/26/2014   Procedure: CLOSED REDUCTION FINGER WITH PERCUTANEOUS PINNING RIGHT THUMB BENNETT'S FRACTURE;  Surgeon: Leanora Cover, MD;  Location: [place];  Service: Orthopedics;  Laterality: Right;  block with sedation   NO PAST SURGERIES      Family History: No family history on file.  Social  History:  Social History   Socioeconomic History   Marital status: Single    Spouse name: Not on file   Number of children: Not on file   Years of education: Not on file   Highest education level: Not on file  Occupational History   Not on file  Tobacco Use   Smoking status: Former    Packs/day: 0.25    Years: 5.00    Pack years: 1.25    Types: Cigarettes   Smokeless tobacco: Never   Tobacco comments:    2 cig./day  Vaping Use   Vaping Use: Never used  Substance and Sexual Activity   Alcohol use: Yes    Comment: weekends   Drug use: Yes    Types: Marijuana   Sexual activity: Not on file  Other Topics Concern   Not on file  Social History Narrative   Not on file   Social Determinants of Health   Financial Resource Strain: Not on file  Food Insecurity: Not on file  Transportation Needs: Not on file  Physical Activity: Not on file  Stress: Not on file  Social Connections: Not on  file  Intimate Partner Violence: Not on file    SDOH:  SDOH Screenings   Alcohol Screen: Not on file  Depression (PHQ2-9): Not on file  Financial Resource Strain: Not on file  Food Insecurity: Not on file  Housing: Not on file  Physical Activity: Not on file  Social Connections: Not on file  Stress: Not on file  Tobacco Use: Not on file  Transportation Needs: Not on file    Last Labs:  No visits with results within 6 Month(s) from this visit.  Latest known visit with results is:  Admission on 04/23/2017, Discharged on 04/23/2017  Component Date Value Ref Range Status   Lipase 04/23/2017 114 (H)  11 - 51 U/L Final   Sodium 04/23/2017 139  135 - 145 mmol/L Final   Potassium 04/23/2017 3.7  3.5 - 5.1 mmol/L Final   Chloride 04/23/2017 102  101 - 111 mmol/L Final   CO2 04/23/2017 24  22 - 32 mmol/L Final   Glucose, Bld 04/23/2017 108 (H)  65 - 99 mg/dL Final   BUN 04/23/2017 8  6 - 20 mg/dL Final   Creatinine, Ser 04/23/2017 0.94  0.61 - 1.24 mg/dL Final   Calcium 04/23/2017 9.0  8.9 - 10.3 mg/dL Final   Total Protein 04/23/2017 7.9  6.5 - 8.1 g/dL Final   Albumin 04/23/2017 4.1  3.5 - 5.0 g/dL Final   AST 04/23/2017 30  15 - 41 U/L Final   ALT 04/23/2017 29  17 - 63 U/L Final   Alkaline Phosphatase 04/23/2017 50  38 - 126 U/L Final   Total Bilirubin 04/23/2017 0.7  0.3 - 1.2 mg/dL Final   GFR calc non Af Amer 04/23/2017 >60  >60 mL/min Final   GFR calc Af Amer 04/23/2017 >60  >60 mL/min Final   Comment: (NOTE) The eGFR has been calculated using the CKD EPI equation. This calculation has not been validated in all clinical situations. eGFR's persistently <60 mL/min signify possible Chronic Kidney Disease.    Anion gap 04/23/2017 13  5 - 15 Final   WBC 04/23/2017 7.6  4.0 - 10.5 K/uL Final   RBC 04/23/2017 4.07 (L)  4.22 - 5.81 MIL/uL Final   Hemoglobin 04/23/2017 13.2  13.0 - 17.0 g/dL Final   HCT 04/23/2017 39.3  39.0 - 52.0 % Final   MCV  04/23/2017 96.6  78.0 - 100.0  fL Final   MCH 04/23/2017 32.4  26.0 - 34.0 pg Final   MCHC 04/23/2017 33.6  30.0 - 36.0 g/dL Final   RDW 04/23/2017 12.4  11.5 - 15.5 % Final   Platelets 04/23/2017 354  150 - 400 K/uL Final   Color, Urine 04/23/2017 YELLOW  YELLOW Final   APPearance 04/23/2017 HAZY (A)  CLEAR Final   Specific Gravity, Urine 04/23/2017 1.018  1.005 - 1.030 Final   pH 04/23/2017 5.0  5.0 - 8.0 Final   Glucose, UA 04/23/2017 NEGATIVE  NEGATIVE mg/dL Final   Hgb urine dipstick 04/23/2017 LARGE (A)  NEGATIVE Final   Bilirubin Urine 04/23/2017 NEGATIVE  NEGATIVE Final   Ketones, ur 04/23/2017 NEGATIVE  NEGATIVE mg/dL Final   Protein, ur 04/23/2017 100 (A)  NEGATIVE mg/dL Final   Nitrite 04/23/2017 NEGATIVE  NEGATIVE Final   Leukocytes, UA 04/23/2017 NEGATIVE  NEGATIVE Final   RBC / HPF 04/23/2017 TOO NUMEROUS TO COUNT  0 - 5 RBC/hpf Final   WBC, UA 04/23/2017 0-5  0 - 5 WBC/hpf Final   Bacteria, UA 04/23/2017 RARE (A)  NONE SEEN Final   Squamous Epithelial / LPF 04/23/2017 0-5 (A)  NONE SEEN Final   Mucus 04/23/2017 PRESENT   Final   D-Dimer, Quant 04/23/2017 0.33  0.00 - 0.50 ug/mL-FEU Final   Comment: (NOTE) At the manufacturer cut-off of 0.50 ug/mL FEU, this assay has been documented to exclude PE with a sensitivity and negative predictive value of 97 to 99%.  At this time, this assay has not been approved by the FDA to exclude DVT/VTE. Results should be correlated with clinical presentation.    Specimen Description 04/23/2017 URINE, CLEAN CATCH   Final   Special Requests 04/23/2017 NONE   Final   Culture 04/23/2017 MULTIPLE SPECIES PRESENT, SUGGEST RECOLLECTION (A)   Final   Report Status 04/23/2017 04/25/2017 FINAL   Final    Allergies: Patient has no known allergies.  PTA Medications: (Not in a hospital admission)   Medical Decision Making  Patient reported that he is currently off his psychotropic and experiencing homicidal ideation as well as hallucination.  Patient recommended for  inpatient psychiatric treatment for safety and stabilization.  Patient will be admitted to [facility] while awaiting inpatient bed to become available. -Start Zyprexa 5 mg/day -CIWA protocol -Ordered lab/reviewed available lab results -Monitoring for safety     Recommendations  Based on my evaluation the patient does not appear to have an emergency medical condition.  Ophelia Shoulder, NP 04/22/21  11:49 PM

## 2021-04-22 NOTE — Progress Notes (Signed)
°   04/22/21 2236  Patient Reported Information  How Did You Hear About [place]? Family/Friend  What Is the Reason for Your Visit/Call Today? Pt reports, having thoughts of wanting to hurt someone. Pt reports, his HI is towards no one in particular, no plan or access to means. Pt reports, when he looks at people they look like they are hurt.  How Long Has This Been Causing You Problems? > than 6 months  What Do You Feel Would Help You the Most Today? Alcohol or Drug Use Treatment;Treatment for Depression or other mood problem  Have You Recently Had Any Thoughts About Hurting Yourself? No  Are You Planning to Commit Suicide/Harm Yourself At This time? No  Have you Recently Had Thoughts About Mike Thompson? Yes  Are You Planning To Harm Someone At This Time? No  Have You Used Any Alcohol or Drugs in the Past 24 Hours? Yes  What Did You Use and How Much? Alcohol and Marijuana.  Do You Currently Have a Therapist/Psychiatrist? No  CCA Screening Triage Referral Assessment  Type of Contact Face-to-Face  Location of Assessment [facility] Assessment Services  Provider location [facility] Assessment Services  Collateral Involvement Pt consented for clinicinan to speak to his family, Mike Thompson and Mike Thompson.  Is CPS involved or ever been involved? Never  Is APS involved or ever been involved? Never  Patient Determined To Be At Risk for Harm To Self or Others Based on Review of Patient Reported Information or Presenting Complaint? Yes, for Harm to Others  Method No Plan  Availability of Means No access or NA  Intent Vague intent or NA  Notification Required No need or identified person  Additional Information for Danger to Others Potential Active psychosis  Additional Comments for Danger to Others Potential Per pt, certain people look like they are hurt.  Are There Guns or Other Weapons in [place]? No  Does Patient Present under Involuntary Commitment? No  Mike Thompson [place]  Determination of Need  Urgent (48 hours)  Options For Referral Facility-Based Crisis;Outpatient Therapy;Inpatient Hospitalization     Determination of need: Urgent.    Mike Thompson, Mike Thompson, Mike Thompson, Mike Thompson Triage Specialist [phone]
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
## 2021-04-23 ENCOUNTER — Encounter (HOSPITAL_COMMUNITY): Payer: Self-pay | Admitting: Emergency Medicine

## 2021-04-23 ENCOUNTER — Inpatient Hospital Stay (HOSPITAL_COMMUNITY)
Admission: AD | Admit: 2021-04-23 | Discharge: 2021-04-27 | DRG: 885 | Disposition: A | Discharge status: Home / Self Care | Payer: 59 | Source: Intra-hospital | Attending: Psychiatry | Admitting: Psychiatry

## 2021-04-23 ENCOUNTER — Other Ambulatory Visit: Payer: Self-pay

## 2021-04-23 DIAGNOSIS — F17210 Nicotine dependence, cigarettes, uncomplicated: Secondary | ICD-10-CM | POA: Diagnosis present

## 2021-04-23 DIAGNOSIS — F250 Schizoaffective disorder, bipolar type: Secondary | ICD-10-CM | POA: Diagnosis present

## 2021-04-23 DIAGNOSIS — F329 Major depressive disorder, single episode, unspecified: Secondary | ICD-10-CM | POA: Diagnosis present

## 2021-04-23 DIAGNOSIS — F1220 Cannabis dependence, uncomplicated: Secondary | ICD-10-CM | POA: Diagnosis present

## 2021-04-23 DIAGNOSIS — R45851 Suicidal ideations: Secondary | ICD-10-CM | POA: Diagnosis present

## 2021-04-23 DIAGNOSIS — G4700 Insomnia, unspecified: Secondary | ICD-10-CM | POA: Diagnosis present

## 2021-04-23 DIAGNOSIS — F17200 Nicotine dependence, unspecified, uncomplicated: Secondary | ICD-10-CM | POA: Diagnosis not present

## 2021-04-23 DIAGNOSIS — F1020 Alcohol dependence, uncomplicated: Secondary | ICD-10-CM | POA: Diagnosis present

## 2021-04-23 DIAGNOSIS — F1420 Cocaine dependence, uncomplicated: Secondary | ICD-10-CM | POA: Diagnosis present

## 2021-04-23 LAB — LIPID PANEL
Cholesterol: 212 mg/dL — ABNORMAL HIGH (ref 0–200)
HDL: 46 mg/dL (ref >40)
LDL Cholesterol: 119 mg/dL — ABNORMAL HIGH (ref 0–99)
Total CHOL/HDL Ratio: 4.6 RATIO
Triglycerides: 235 mg/dL — ABNORMAL HIGH (ref <150)
VLDL: 47 mg/dL — ABNORMAL HIGH (ref 0–40)

## 2021-04-23 LAB — CBC WITH DIFFERENTIAL/PLATELET
Abs Immature Granulocytes: 0.01 10*3/uL (ref 0.00–0.07)
Basophils Absolute: 0 10*3/uL (ref 0.0–0.1)
Basophils Relative: 0 %
Eosinophils Absolute: 0 10*3/uL (ref 0.0–0.5)
Eosinophils Relative: 1 %
HCT: 41 % (ref 39.0–52.0)
Hemoglobin: 14.2 g/dL (ref 13.0–17.0)
Immature Granulocytes: 0 %
Lymphocytes Relative: 44 %
Lymphs Abs: 3.1 10*3/uL (ref 0.7–4.0)
MCH: 33.5 pg (ref 26.0–34.0)
MCHC: 34.6 g/dL (ref 30.0–36.0)
MCV: 96.7 fL (ref 80.0–100.0)
Monocytes Absolute: 0.6 10*3/uL (ref 0.1–1.0)
Monocytes Relative: 9 %
Neutro Abs: 3.2 10*3/uL (ref 1.7–7.7)
Neutrophils Relative %: 46 %
Platelets: 369 10*3/uL (ref 150–400)
RBC: 4.24 MIL/uL (ref 4.22–5.81)
RDW: 12 % (ref 11.5–15.5)
WBC: 7 10*3/uL (ref 4.0–10.5)
nRBC: 0 % (ref 0.0–0.2)

## 2021-04-23 LAB — POCT URINE DRUG SCREEN - MANUAL ENTRY (I-SCREEN)
POC Amphetamine UR: NOT DETECTED
POC Buprenorphine (BUP): NOT DETECTED
POC Cocaine UR: POSITIVE — AB
POC Marijuana UR: POSITIVE — AB
POC Methadone UR: NOT DETECTED
POC Methamphetamine UR: NOT DETECTED
POC Morphine: NOT DETECTED
POC Oxazepam (BZO): NOT DETECTED
POC Oxycodone UR: NOT DETECTED
POC Secobarbital (BAR): NOT DETECTED

## 2021-04-23 LAB — COMPREHENSIVE METABOLIC PANEL
ALT: 20 U/L (ref 0–44)
AST: 20 U/L (ref 15–41)
Albumin: 4.4 g/dL (ref 3.5–5.0)
Alkaline Phosphatase: 47 U/L (ref 38–126)
Anion gap: 12 (ref 5–15)
BUN: 11 mg/dL (ref 6–20)
CO2: 27 mmol/L (ref 22–32)
Calcium: 8.9 mg/dL (ref 8.9–10.3)
Chloride: 101 mmol/L (ref 98–111)
Creatinine, Ser: 0.85 mg/dL (ref 0.61–1.24)
GFR, Estimated: >60 mL/min (ref >60)
Glucose, Bld: 114 mg/dL — ABNORMAL HIGH (ref 70–99)
Potassium: 3.5 mmol/L (ref 3.5–5.1)
Sodium: 140 mmol/L (ref 135–145)
Total Bilirubin: 1.4 mg/dL — ABNORMAL HIGH (ref 0.3–1.2)
Total Protein: 7.7 g/dL (ref 6.5–8.1)

## 2021-04-23 LAB — RESP PANEL BY RT-PCR (FLU A&B, COVID) ARPGX2
Influenza A by PCR: NEGATIVE
Influenza B by PCR: NEGATIVE
SARS Coronavirus 2 by RT PCR: NEGATIVE

## 2021-04-23 LAB — TSH: TSH: 0.843 u[IU]/mL (ref 0.350–4.500)

## 2021-04-23 LAB — HEMOGLOBIN A1C
Hgb A1c MFr Bld: 4.4 % — ABNORMAL LOW (ref 4.8–5.6)
Mean Plasma Glucose: 79.58 mg/dL

## 2021-04-23 LAB — ETHANOL: Alcohol, Ethyl (B): <10 mg/dL (ref <10)

## 2021-04-23 LAB — POC SARS CORONAVIRUS 2 AG: SARSCOV2ONAVIRUS 2 AG: NEGATIVE

## 2021-04-23 MED ORDER — LORAZEPAM 1 MG PO TABS: 1.0000 mg | ORAL_TABLET | Freq: Four times a day (QID) | ORAL | Status: AC | PRN

## 2021-04-23 MED ORDER — ONDANSETRON 4 MG PO TBDP: 4.0000 mg | ORAL_TABLET | Freq: Four times a day (QID) | ORAL | Status: DC | PRN

## 2021-04-23 MED ORDER — ADULT MULTIVITAMIN W/MINERALS CH
1.0000 | ORAL_TABLET | Freq: Every day | ORAL | Status: DC
  Administered 2021-04-23 – 2021-04-27 (×5): 1 via ORAL
  Filled 2021-04-23 (×9): qty 1

## 2021-04-23 MED ORDER — THIAMINE HCL 100 MG/ML IJ SOLN
100.0000 mg | Freq: Once | INTRAMUSCULAR | Status: DC
  Filled 2021-04-23: qty 2

## 2021-04-23 MED ORDER — LOPERAMIDE HCL 2 MG PO CAPS: 2.0000 mg | ORAL_CAPSULE | ORAL | Status: AC | PRN

## 2021-04-23 MED ORDER — ACETAMINOPHEN 325 MG PO TABS: 650.0000 mg | ORAL_TABLET | Freq: Four times a day (QID) | ORAL | Status: DC | PRN

## 2021-04-23 MED ORDER — LORAZEPAM 1 MG PO TABS: 1.0000 mg | ORAL_TABLET | Freq: Four times a day (QID) | ORAL | Status: DC | PRN

## 2021-04-23 MED ORDER — ADULT MULTIVITAMIN W/MINERALS CH
1.0000 | ORAL_TABLET | Freq: Every day | ORAL | Status: DC
  Administered 2021-04-23: 1 via ORAL
  Filled 2021-04-23: qty 1

## 2021-04-23 MED ORDER — ALUM & MAG HYDROXIDE-SIMETH 200-200-20 MG/5ML PO SUSP: 30.0000 mL | ORAL | Status: DC | PRN

## 2021-04-23 MED ORDER — LOPERAMIDE HCL 2 MG PO CAPS: 2.0000 mg | ORAL_CAPSULE | ORAL | Status: DC | PRN

## 2021-04-23 MED ORDER — ONDANSETRON 4 MG PO TBDP: 4.0000 mg | ORAL_TABLET | Freq: Four times a day (QID) | ORAL | Status: AC | PRN

## 2021-04-23 MED ORDER — THIAMINE HCL 100 MG PO TABS: 100.0000 mg | ORAL_TABLET | Freq: Every day | ORAL | Status: DC

## 2021-04-23 MED ORDER — HYDROXYZINE HCL 25 MG PO TABS: 25.0000 mg | ORAL_TABLET | Freq: Four times a day (QID) | ORAL | Status: DC | PRN

## 2021-04-23 MED ORDER — HYDROXYZINE HCL 25 MG PO TABS
25.0000 mg | ORAL_TABLET | Freq: Four times a day (QID) | ORAL | Status: AC | PRN
  Administered 2021-04-25: 25 mg via ORAL

## 2021-04-23 MED ORDER — THIAMINE HCL 100 MG PO TABS
100.0000 mg | ORAL_TABLET | Freq: Every day | ORAL | Status: DC
  Administered 2021-04-24 – 2021-04-27 (×4): 100 mg via ORAL
  Filled 2021-04-23 (×7): qty 1

## 2021-04-23 MED ORDER — MAGNESIUM HYDROXIDE 400 MG/5ML PO SUSP: 30.0000 mL | Freq: Every day | ORAL | Status: DC | PRN

## 2021-04-23 MED ORDER — THIAMINE HCL 100 MG/ML IJ SOLN
100.0000 mg | Freq: Once | INTRAMUSCULAR | Status: AC
  Administered 2021-04-23: 100 mg via INTRAMUSCULAR
  Filled 2021-04-23: qty 2

## 2021-04-23 MED ORDER — HYDROXYZINE HCL 25 MG PO TABS
25.0000 mg | ORAL_TABLET | Freq: Three times a day (TID) | ORAL | Status: DC | PRN
  Filled 2021-04-23 (×2): qty 1

## 2021-04-23 MED ORDER — TRAZODONE HCL 50 MG PO TABS
50.0000 mg | ORAL_TABLET | Freq: Every evening | ORAL | Status: DC | PRN
  Filled 2021-04-23: qty 1

## 2021-04-23 NOTE — ED Notes (Signed)
Report called to Westly Pam RN at [facility].  Verbalized understanding.   Pt will be transported after 12:30 as per request.

## 2021-04-23 NOTE — ED Notes (Signed)
Pt appears to be sleeping at this time.  Breathing even and unlabored.  Will continue to monitor for safety.  °

## 2021-04-23 NOTE — Progress Notes (Signed)
[facility] called and will pick patient up ASAP for transport to [facility].    Hermon Zea, LCSW, LCAS Clincal Social Worker  [facility]

## 2021-04-23 NOTE — ED Notes (Addendum)
Pt admitted to [facility] due to SI, HI, and substance use. Pt A&O x4, calm and cooperative. Denies current SI/HI/AVH. Pt tolerated lab work and skin assessment well. Pt ambulated independently to unit. Oriented to unit/staff. Pt declined offer for food. OJ given. Pt took medication without difficulty. No signs of acute distress noted. Will continue to monitor for safety.

## 2021-04-23 NOTE — BH Assessment (Signed)
Clinician obtained consent to discuss pt disposition and process with pt's family Reather Converse [place], [phone] and Essie Christine, cousin, [phone]). Pt's family would like an update. Clinician expressed the pt has to consent before information given. Family verbalized understanding.    Vertell Novak, [place], [facility], [facility] Triage Specialist [phone]
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
## 2021-04-23 NOTE — Progress Notes (Signed)
Pt has been a sleep since the beginning of the shift, could not wake up to take his scheduled B 1 injectable, was rescheduled for tomorrow 0800 am. Will continue to monitor.

## 2021-04-23 NOTE — Progress Notes (Addendum)
°   Patient has been accepted to [facility] 305-2  Patient meets [facility] inpatient criteria per Dr. Bronwen Betters  The accepting provider will be Dr.Singleton   Bedelia Person, RN @ [facility] has been notified regarding the acceptance.   Patient can arrive to [facility] today at 1230. Please send voluntary consent via fax prior to transporting the Pt to the facility.    Damita Dunnings, MSW, LCSW-A  11:08 AM 04/23/2021

## 2021-04-23 NOTE — ED Notes (Signed)
Pt resting quietly.  Awoke and given medications. Denies tremors or anxiety.  Pt fell back asleep without problems. Will continue to monitor for safety.

## 2021-04-23 NOTE — ED Provider Notes (Signed)
FBC/OBS ASAP Discharge Summary  Date and Time: 04/23/2021 11:52 AM  Name: Mike Thompson  MRN:  [id number]   Discharge Diagnoses:  Final diagnoses:  Schizoaffective disorder, bipolar type (HCC)  Homicidal ideations    Subjective: Mike Thompson is a 36 year old African-American male who presents with suicidal ideations and paranoia.  Patient was accepted to [facility] behavioral health for inpatient admission.  Patient to be transferred 04/23/2021 after 12:30 AM.  Stay Summary: per admission assessment note: Mike Thompson is a 36 year old male who presents voluntary and unaccompanied (family in the waiting room) to [facility]. Clinician asked the pt, "what brought you to the hospital?" Pt reports, he just wants to hurt someone, no person in particular with no means or intent. Pt reports, he did say he wanted cops to kill him but that was earlier today. Pt reports, he took three Percocets, he was not trying to kill himself. Pt reports, when he looks at certain people look like they're hurt. Pt denies, current SI, self-injurious behaviors and access to weapons. Pt reports, drinking a 40 oz and 16 oz of beer today. Pt reports, he drinks everyday. Pt reports, he smokes three blunts earlier today, but smoke marijuana everyday. Pt reports, he was seeing a psychiatrist at [facility] two years ago and taking Zyrexa. Pt reports, a previous diagnosis of Schizoaffective Disorder. Pt reports, previous inpatient admission at [facility]."       Total Time spent with patient: 15 minutes  Past Psychiatric History: Schizoaffective disorder, major depressive disorder and alcohol use disorder  Past Medical History:  Past Medical History:  Diagnosis Date   Bennett's fracture of base of metacarpal of right thumb 09/16/2014   Schizophrenia (HCC)     Past Surgical History:  Procedure Laterality Date   CLOSED REDUCTION FINGER WITH PERCUTANEOUS PINNING Right 09/26/2014   Procedure: CLOSED REDUCTION FINGER WITH PERCUTANEOUS  PINNING RIGHT THUMB BENNETT'S FRACTURE;  Surgeon: Betha Loa, MD;  Location: [facility];  Service: Orthopedics;  Laterality: Right;  block with sedation   NO PAST SURGERIES     Family History: No family history on file. Family Psychiatric History:  Social History:  Social History   Substance and Sexual Activity  Alcohol Use Yes   Comment: weekends     Social History   Substance and Sexual Activity  Drug Use Yes   Types: Marijuana    Social History   Socioeconomic History   Marital status: Single    Spouse name: Not on file   Number of children: Not on file   Years of education: Not on file   Highest education level: Not on file  Occupational History   Not on file  Tobacco Use   Smoking status: Former    Packs/day: 0.25    Years: 5.00    Pack years: 1.25    Types: Cigarettes   Smokeless tobacco: Never   Tobacco comments:    2 cig./day  Vaping Use   Vaping Use: Never used  Substance and Sexual Activity   Alcohol use: Yes    Comment: weekends   Drug use: Yes    Types: Marijuana   Sexual activity: Not on file  Other Topics Concern   Not on file  Social History Narrative   Not on file   Social Determinants of Health   Financial Resource Strain: Not on file  Food Insecurity: Not on file  Transportation Needs: Not on file  Physical Activity: Not on file  Stress: Not on file  Social  Connections: Not on file   SDOH:  SDOH Screenings   Alcohol Screen: Not on file  Depression (PHQ2-9): Not on file  Financial Resource Strain: Not on file  Food Insecurity: Not on file  Housing: Not on file  Physical Activity: Not on file  Social Connections: Not on file  Stress: Not on file  Tobacco Use: Not on file  Transportation Needs: Not on file    Tobacco Cessation:  N/A, patient does not currently use tobacco products  Current Medications:  Current Facility-Administered Medications  Medication Dose Route Frequency Provider Last Rate Last Admin    acetaminophen (TYLENOL) tablet 650 mg  650 mg Oral Q6H PRN Ajibola, Ene A, NP       alum & mag hydroxide-simeth (MAALOX/MYLANTA) 200-200-20 MG/5ML suspension 30 mL  30 mL Oral Q4H PRN Ajibola, Ene A, NP       hydrOXYzine (ATARAX) tablet 25 mg  25 mg Oral Q6H PRN Ajibola, Ene A, NP       loperamide (IMODIUM) capsule 2-4 mg  2-4 mg Oral PRN Ajibola, Ene A, NP       LORazepam (ATIVAN) tablet 1 mg  1 mg Oral Q6H PRN Ajibola, Ene A, NP       magnesium hydroxide (MILK OF MAGNESIA) suspension 30 mL  30 mL Oral Daily PRN Ajibola, Ene A, NP       multivitamin with minerals tablet 1 tablet  1 tablet Oral Daily Ajibola, Ene A, NP   1 tablet at 04/23/21 0929   OLANZapine (ZYPREXA) tablet 5 mg  5 mg Oral QHS Ajibola, Ene A, NP   5 mg at 04/23/21 0025   ondansetron (ZOFRAN-ODT) disintegrating tablet 4 mg  4 mg Oral Q6H PRN Ajibola, Ene A, NP       [START ON 04/24/2021] thiamine tablet 100 mg  100 mg Oral Daily Ajibola, Ene A, NP       traZODone (DESYREL) tablet 50 mg  50 mg Oral QHS PRN Ajibola, Ene A, NP       No current outpatient medications on file.    PTA Medications: (Not in a hospital admission)   Musculoskeletal  Strength & Muscle Tone: within normal limits Gait & Station: normal Patient leans: N/A  Psychiatric Specialty Exam  Presentation  General Appearance: Casual  Eye Contact:Minimal  Speech:Clear and Coherent  Speech Volume:Normal  Handedness:Right   Mood and Affect  Mood:Irritable  Affect:Blunt   Thought Process  Thought Processes:Coherent  Descriptions of Associations:Intact  Orientation:Full (Time, Place and Person)  Thought Content:WDL   Duration of Psychotic Symptoms: Greater than six months   Hallucinations:Hallucinations: Visual Description of Visual Hallucinations: faces of injured people"  Ideas of Reference:None  Suicidal Thoughts:Suicidal Thoughts: Yes, Active ("wants to be killed by cops.") SI Active Intent and/or Plan: With Plan  Homicidal  Thoughts:Homicidal Thoughts: Yes, Passive   Sensorium  Memory:Immediate Good; Recent Fair; Remote Fair  Judgment:Poor  Insight:Poor   Executive Functions  Concentration:Fair  Attention Span:Fair  Recall:Fair  Fund of Knowledge:Fair  Language:Fair   Psychomotor Activity  Psychomotor Activity:Psychomotor Activity: Normal   Assets  Assets:Desire for Improvement; Social Support; Physical Health   Sleep  Sleep:Sleep: Poor Number of Hours of Sleep: 0 (pt says he has not slept in 3 days)   Nutritional Assessment (For OBS and FBC admissions only) Has the patient had a weight loss or gain of 10 pounds or more in the last 3 months?: No Has the patient had a decrease in food intake/or appetite?: No Does  the patient have dental problems?: No Does the patient have eating habits or behaviors that may be indicators of an eating disorder including binging or inducing vomiting?: No Has the patient recently lost weight without trying?: 0 Has the patient been eating poorly because of a decreased appetite?: 0 Malnutrition Screening Tool Score: 0    Physical Exam  Physical Exam Vitals and nursing note reviewed.  HENT:     Head: Normocephalic.  Eyes:     Pupils: Pupils are equal, round, and reactive to light.  Cardiovascular:     Rate and Rhythm: Normal rate.  Pulmonary:     Effort: Pulmonary effort is normal.     Breath sounds: Normal breath sounds.  Psychiatric:        Attention and Perception: Attention and perception normal.        Mood and Affect: Mood is depressed.        Speech: Speech normal.        Behavior: Behavior is cooperative.        Thought Content: Thought content normal.        Cognition and Memory: Cognition normal.        Judgment: Judgment normal.   Review of Systems  Psychiatric/Behavioral:  Positive for depression, substance abuse and suicidal ideas. The patient is nervous/anxious.   All other systems reviewed and are negative. Blood pressure  119/78, pulse 64, temperature 97.7 F (36.5 C), temperature source Oral, resp. rate 18, SpO2 99 %. There is no height or weight on file to calculate BMI.  Demographic Factors:  Male  Loss Factors: Financial problems/change in socioeconomic status  Historical Factors: Family history of mental illness or substance abuse  Risk Reduction Factors:   Living with another person, especially a relative  Continued Clinical Symptoms:  Depression:   Aggression Comorbid alcohol abuse/dependence  Cognitive Features That Contribute To Risk:  Closed-mindedness    Suicide Risk:  Minimal: No identifiable suicidal ideation.  Patients presenting with no risk factors but with morbid ruminations; may be classified as minimal risk based on the severity of the depressive symptoms  Plan Of Care/Follow-up recommendations:  Activity:  as tolerated  Diet:  heart healthy   Disposition: Patient was [facility] 305.2  Take all medications as prescribed. Keep all follow-up appointments as scheduled.  Do not consume alcohol or use illegal drugs while on prescription medications. Report any adverse effects from your medications to your primary care provider promptly.  In the event of recurrent symptoms or worsening symptoms, call 911, a crisis hotline, or go to the nearest emergency department for evaluation.    Oneta Rackanika N Jezabella Schriever, NP 04/23/2021, 11:52 AM

## 2021-04-23 NOTE — Tx Team (Signed)
Initial Treatment Plan 04/23/2021 4:59 PM Mike Thompson [id number]    PATIENT STRESSORS: Financial difficulties   Marital or family conflict   Occupational concerns   Substance abuse     PATIENT STRENGTHS: Motivation for treatment/growth  Physical Health  Supportive family/friends    PATIENT IDENTIFIED PROBLEMS: Substance abuse  Depression  Anxiety  AVH               DISCHARGE CRITERIA:  Adequate post-discharge living arrangements Improved stabilization in mood, thinking, and/or behavior  PRELIMINARY DISCHARGE PLAN: Outpatient therapy Placement in alternative living arrangements  PATIENT/FAMILY INVOLVEMENT: This treatment plan has been presented to and reviewed with the patient, Mike Thompson.  The patient has been given the opportunity to ask questions and make suggestions.  Gerrianne Scale, RN 04/23/2021, 4:59 PM

## 2021-04-23 NOTE — ED Notes (Signed)
Pt was discharged to safe transport without incident.All his belongings from locker 17 were given to safe transport.  No distress noted.   Pt was escorted out the sally port by staff.

## 2021-04-23 NOTE — Discharge Instructions (Addendum)
Take all medications as prescribed. Keep all follow-up appointments as scheduled.  Do not consume alcohol or use illegal drugs while on prescription medications. Report any adverse effects from your medications to your primary care provider promptly.  In the event of recurrent symptoms or worsening symptoms, call 911, a crisis hotline, or go to the nearest emergency department for evaluation.   

## 2021-04-23 NOTE — ED Notes (Signed)
Pt asleep in bed. Respirations even and unlabored. Will continue to monitor for safety. ?

## 2021-04-24 DIAGNOSIS — F1420 Cocaine dependence, uncomplicated: Secondary | ICD-10-CM | POA: Diagnosis present

## 2021-04-24 LAB — PROLACTIN: Prolactin: 5.3 ng/mL (ref 4.0–15.2)

## 2021-04-24 LAB — TSH: TSH: 1.796 u[IU]/mL (ref 0.350–4.500)

## 2021-04-24 MED ORDER — ZIPRASIDONE MESYLATE 20 MG IM SOLR: 20.0000 mg | INTRAMUSCULAR | Status: DC | PRN

## 2021-04-24 MED ORDER — LORAZEPAM 1 MG PO TABS: 1.0000 mg | ORAL_TABLET | ORAL | Status: DC | PRN

## 2021-04-24 MED ORDER — OLANZAPINE 10 MG PO TBDP: 10.0000 mg | ORAL_TABLET | Freq: Three times a day (TID) | ORAL | Status: DC | PRN

## 2021-04-24 MED ORDER — OLANZAPINE 5 MG PO TABS
5.0000 mg | ORAL_TABLET | Freq: Every day | ORAL | Status: DC
  Administered 2021-04-24: 5 mg via ORAL
  Filled 2021-04-24 (×2): qty 1
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
## 2021-04-24 NOTE — B[facility] Group Notes (Signed)
Adult Psychoeducational Group Note  Date:  04/24/2021 Time:  11:05 PM  Group Topic/Focus:  Coping With Mental Health Crisis:   The purpose of this group is to help patients identify strategies for coping with mental health crisis.  Group discusses possible causes of crisis and ways to manage them effectively.  Participation Level:  Active  Participation Quality:  Appropriate  Affect:  Appropriate  Cognitive:  Appropriate  Insight: Good  Engagement in Group:  Engaged  Modes of Intervention:  Discussion  Additional Comments  Jacalyn Lefevre 04/24/2021, 11:05 PM

## 2021-04-24 NOTE — Group Note (Signed)
Recreation Therapy Group Note   Group Topic:Animal Assisted Therapy   Group Date: 04/24/2021 Start Time: 1430 End Time: 1510 Facilitators: Victorino Sparrow, LRT,CTRS Location: [address] Dayroom   Animal-Assisted Activity (AAA) Program Checklist/Progress Note Patient Eligibility Criteria Checklist & Daily Group note for Rec Tx Intervention   AAA/T Program Assumption of Risk Form signed by Patient/ or Parent Legal Guardian YES  Patient understands their participation is voluntary YES   Group Description: Patients provided opportunity to interact with trained and credentialed [employer] Therapy dog and the community volunteer/dog handler. Patients practiced appropriate animal interaction and were educated on dog safety outside of the hospital in common community settings. Patients were allowed to use dog toys and other items to practice commands, engage the dog in play, and/or complete routine aspects of animal care.   Education: Contractor, Pensions consultant, Communication & Social Skills    Affect/Mood: N/A   Participation Level: Did not attend    Clinical Observations/Individualized Feedback:     Plan: Continue to engage patient in RT group sessions 2-3x/week.   Victorino Sparrow, LRT,CTRS 04/24/2021 3:45 PM

## 2021-04-24 NOTE — Progress Notes (Signed)
Pt denies SI but did endorse HI/AVH and verbally agrees to approach staff if these become apparent or before harming themselves/others. Pt stated that he was not having HI towards anyone on the unit. Pt stated that voices were still telling him to kill other people and that he could still see people that were already hurt. Rates depression 0/10. Rates anxiety 0/10. Rates pain 0/10.  Pt has been sleeping for a lot for a lot of the day, whether in the nook or in his room. Scheduled medications administered to pt, per MD orders. RN provided support and encouragement to pt. Q15 min safety checks implemented and continued. Pt safe on the unit. RN will continue to monitor and intervene as needed.   04/24/21 1200  Psych Admission Type (Psych Patients Only)  Admission Status Voluntary  Psychosocial Assessment  Patient Complaints None  Eye Contact Brief  Facial Expression Flat  Affect Sad;Flat;Depressed  Speech Logical/coherent;Soft  Interaction Minimal;Forwards little;Guarded;Isolative  Motor Activity Other (Comment) (WDL)  Appearance/Hygiene Unremarkable  Behavior Characteristics Appropriate to situation;Cooperative  Mood Depressed;Pleasant;Sad  Aggressive Behavior  Effect No apparent injury  Thought Process  Coherency WDL  Content WDL  Delusions None reported or observed  Perception Hallucinations  Hallucination Auditory;Visual  Judgment Impaired  Confusion None  Danger to Self  Current suicidal ideation? Denies  Danger to Others  Danger to Others None reported or observed

## 2021-04-24 NOTE — B[facility] Counselor (Signed)
CSW spoke with [facility] who stated pt has 3 active warrants at this time. CSW spoke with Greig Right [phone] (Attorney at [facility]) who states an order to release will be dropped off to [facility] tomorrow, 04/25/2021.  Fredirick Lathe, LCSWA Clinicial Social Worker [employer]

## 2021-04-24 NOTE — [facility]HH Suicide Risk Assessment (Signed)
Suicide Risk Assessment  Admission Assessment    [facility] Admission Suicide Risk Assessment   Nursing information obtained from:  Patient Demographic factors:  Male, Low socioeconomic status, Unemployed Current Mental Status:  Suicidal ideation indicated by others, Thoughts of violence towards others Loss Factors:  Financial problems / change in socioeconomic status Historical Factors:  Impulsivity, Domestic violence Risk Reduction Factors:  Responsible for children under 56 years of age  Total Time spent with patient: 45 minutes Principal Problem: MDD (major depressive disorder) Diagnosis:  Principal Problem:   MDD (major depressive disorder) Active Problems:   TOBACCO ABUSE   Schizoaffective disorder, bipolar type (HCC)   Cannabis dependence, uncomplicated (HCC)   Alcohol dependence, uncomplicated (HCC)   Cocaine dependence (HCC)  Subjective Data: Mike Thompson is a 36 yo patient w/ a PPH of schizoaffective disorder, bipolar type who presented to [facility] voluntarily endorsing HI.Patient denies that he is having AVH today, but reports that he has last had AH yesterday instructing him to kill someone else. Patient reports that he has VH on occasion and the last time was 2-3 days ago. Patient reports that he is aware that at times what he is seeing is not real. Patient endorses that he is very bothered by his command AH and does not wish to act on these.Patient reports that over the last few weeks he has not been sleeping very well, anhedonia, increased feelings of hopelessness and worthlessness, decrease in concentration, and decreased appetite.On assessment today patient denies SI, HI, and AVH but is concerned that HI may return.     Continued Clinical Symptoms:  Alcohol Use Disorder Identification Test Final Score (AUDIT): 20 The "Alcohol Use Disorders Identification Test", Guidelines for Use in Primary Care, Second Edition.  World Science writer [facility]). Score between 0-7:  no or low  risk or alcohol related problems. Score between 8-15:  moderate risk of alcohol related problems. Score between 16-19:  high risk of alcohol related problems. Score 20 or above:  warrants further diagnostic evaluation for alcohol dependence and treatment.   CLINICAL FACTORS:   Previous Psychiatric Diagnoses and Treatments   Musculoskeletal: Strength & Muscle Tone: within normal limits Gait & Station: normal Patient leans: N/A  Psychiatric Specialty Exam:  Presentation  General Appearance: Appropriate for Environment  Eye Contact:Minimal  Speech:Clear and Coherent  Speech Volume:Decreased  Handedness:Right   Mood and Affect  Mood:Hopeless; Dysphoric; Anxious  Affect:Congruent   Thought Process  Thought Processes:Goal Directed  Descriptions of Associations:Circumstantial  Orientation:Full (Time, Place and Person)  Thought Content:Illogical  History of Schizophrenia/Schizoaffective disorder:No data recorded Duration of Psychotic Symptoms:Greater than six months  Hallucinations:Hallucinations: Auditory; Command Description of Command Hallucinations: a voice telling him to kill people  Ideas of Reference:None  Suicidal Thoughts:Suicidal Thoughts: No  Homicidal Thoughts:Homicidal Thoughts: No   Sensorium  Memory:Immediate Good; Recent Good; Remote Good  Judgment:Impaired  Insight:Shallow   Executive Functions  Concentration:Fair  Attention Span:Fair  Recall:Fair  Fund of Knowledge:Fair  Language:Fair   Psychomotor Activity  Psychomotor Activity:Psychomotor Activity: Normal   Assets  Assets:Communication Skills; Housing; Resilience; Social Support   Sleep  Sleep:Sleep: Poor    Physical Exam: Physical Exam HENT:     Head: Normocephalic and atraumatic.  Pulmonary:     Effort: Pulmonary effort is normal.  Neurological:     Mental Status: He is alert and oriented to person, place, and time.   Review of Systems  Respiratory:   Positive for cough.   Cardiovascular:  Negative for chest pain.  Gastrointestinal:  Negative for abdominal pain and nausea.  Neurological:  Negative for dizziness.  Blood pressure 127/88, pulse 81, temperature 98.8 F (37.1 C), temperature source Oral, resp. rate 18, height 5\' 7"  (1.702 m), weight 66.1 kg, SpO2 99 %. Body mass index is 22.84 kg/m.   COGNITIVE FEATURES THAT CONTRIBUTE TO RISK:  None    SUICIDE RISK:   Severe:  Frequent, intense, and enduring suicidal ideation, specific plan, no subjective intent, but some objective markers of intent (i.e., choice of lethal method), the method is accessible, some limited preparatory behavior, evidence of impaired self-control, severe dysphoria/symptomatology, multiple risk factors present, and few if any protective factors, particularly a lack of social support.  PLAN OF CARE: Admit due to HI and psychotic symptoms. He needs crisis stabilization, safety monitoring and medication management.      I certify that inpatient services furnished can reasonably be expected to improve the patient's condition.    PGY-2 , MD 04/24/2021, 5:03 PM

## 2021-04-25 ENCOUNTER — Encounter (HOSPITAL_COMMUNITY): Payer: Self-pay

## 2021-04-25 MED ORDER — OLANZAPINE 10 MG PO TABS
10.0000 mg | ORAL_TABLET | Freq: Every day | ORAL | Status: DC
  Administered 2021-04-25 – 2021-04-26 (×2): 10 mg via ORAL
  Filled 2021-04-25 (×3): qty 1

## 2021-04-25 NOTE — Group Note (Signed)
LCSW Group Therapy Note  Group Date: 04/25/2021 Start Time: 1300 End Time: 1400   Type of Therapy and Topic:  Group Therapy: Self-Harm Alternatives  Participation Level:  None   Description of Group:   Patients participated in a discussion regarding non-suicidal self-injurious behavior (NSSIB, or self-harm) and the stigma surrounding it. There was also discussion surrounding how other maladaptive coping skills could be seen as self-harm, such as substance abuse. Participants were invited to share their experiences with self-harm, with emphasis being placed on the motivation for self-harm (such as release, punishment, feeling numb, etc). Patients were then asked to brainstorm potential substitutions for self-harm and were provided with a handout entitled, "Distraction Techniques and Alternative Coping Strategies," published by [employer] for Self-Injury Recovery.  Therapeutic Goals:  Patients will be given the opportunity to discuss NSSIB in a non-judgmental and therapeutic environment. Patients will identify which feelings lead to NSSIB.  Patients will discuss potential healthy coping skills to replace NSSIB Open discussion will specifically address stigma and shame surrounding NSSIB.   Summary of Patient Progress:  Pt came late to group, accepted worksheet and then left.   Therapeutic Modalities:   Cognitive Behavioral Therapy   Chrys Racer 04/25/2021  1:42 PM

## 2021-04-25 NOTE — Progress Notes (Signed)
[facility] MD Progress Note  04/25/2021 3:25 PM Mike Thompson  MRN:  [id number] Subjective:  Mike Thompson is a 36 yo patient w/ a PPH of schizoaffective disorder, bipolar type who presented to [facility] voluntarily endorsing HI.  Case was discussed in the multidisciplinary team. MAR was reviewed and patient was compliant with medications.  He did not require any PRN's for agitation.     Psychiatric Team made the following recommendations yesterday:  - Start Zyprexa 5mg  QHS, intend to titrate up as tolerated  ON assessment this AM patient reports that he is still hearing a voice in his head telling him that he does need help and that he should leave. Patient reports that he also feels that someone is putting these thoughts into his head. Patient reports that he does not feel paranoid towards staff or patient's trying to hurt him, but he is still hypervigilant. Patient denies SI, HI, and AH. Patient reports that he feels that the Zyprexa is helping with his HI and command hallucinations. Patient reports that he will try to attend more group therapy sessions.   Principal Problem: MDD (major depressive disorder) Diagnosis: Principal Problem:   MDD (major depressive disorder) Active Problems:   TOBACCO ABUSE   Schizoaffective disorder, bipolar type (HCC)   Cannabis dependence, uncomplicated (HCC)   Alcohol dependence, uncomplicated (Mike Thompson)   Cocaine dependence ([place])  Total Time spent with patient: 15 minutes  Past Psychiatric History: See H&P  Past Medical History:  Past Medical History:  Diagnosis Date   Bennett's fracture of base of metacarpal of right thumb 09/16/2014   Schizophrenia ([place])     Past Surgical History:  Procedure Laterality Date   CLOSED REDUCTION FINGER WITH PERCUTANEOUS PINNING Right 09/26/2014   Procedure: CLOSED REDUCTION FINGER WITH PERCUTANEOUS PINNING RIGHT THUMB BENNETT'S FRACTURE;  Surgeon: Leanora Cover, MD;  Location: [facility];  Service: Orthopedics;   Laterality: Right;  block with sedation   NO PAST SURGERIES     Family History: History reviewed. No pertinent family history. Family Psychiatric  History: See H&P Social History:  Social History   Substance and Sexual Activity  Alcohol Use Yes   Alcohol/week: 18.0 standard drinks   Types: 14 Cans of beer, 4 Shots of liquor per week     Social History   Substance and Sexual Activity  Drug Use Yes   Types: Marijuana    Social History   Socioeconomic History   Marital status: Single    Spouse name: Not on file   Number of children: Not on file   Years of education: Not on file   Highest education level: Not on file  Occupational History   Not on file  Tobacco Use   Smoking status: Some Days    Packs/day: 0.25    Years: 5.00    Pack years: 1.25    Types: Cigarettes   Smokeless tobacco: Never   Tobacco comments:    2 cig./day  Vaping Use   Vaping Use: Never used  Substance and Sexual Activity   Alcohol use: Yes    Alcohol/week: 18.0 standard drinks    Types: 14 Cans of beer, 4 Shots of liquor per week   Drug use: Yes    Types: Marijuana   Sexual activity: Not on file  Other Topics Concern   Not on file  Social History Narrative   Not on file   Social Determinants of Health   Financial Resource Strain: Not on file  Food Insecurity: Not on file  Transportation Needs: Not on file  Physical Activity: Not on file  Stress: Not on file  Social Connections: Not on file   Additional Social History:                         Sleep: Good  Appetite:  Fair  Current Medications: Current Facility-Administered Medications  Medication Dose Route Frequency Provider Last Rate Last Admin   acetaminophen (TYLENOL) tablet 650 mg  650 mg Oral Q6H PRN Derrill Center, NP       alum & mag hydroxide-simeth (MAALOX/MYLANTA) 200-200-20 MG/5ML suspension 30 mL  30 mL Oral Q4H PRN Derrill Center, NP       hydrOXYzine (ATARAX) tablet 25 mg  25 mg Oral TID PRN Derrill Center, NP       hydrOXYzine (ATARAX) tablet 25 mg  25 mg Oral Q6H PRN Derrill Center, NP   25 mg at 04/25/21 1329   loperamide (IMODIUM) capsule 2-4 mg  2-4 mg Oral PRN Derrill Center, NP       LORazepam (ATIVAN) tablet 1 mg  1 mg Oral Q6H PRN Derrill Center, NP       OLANZapine zydis (ZYPREXA) disintegrating tablet 10 mg  10 mg Oral Q8H PRN Freida Busman, MD       And   LORazepam (ATIVAN) tablet 1 mg  1 mg Oral PRN Damita Dunnings B, MD       And   ziprasidone (GEODON) injection 20 mg  20 mg Intramuscular PRN Damita Dunnings B, MD       magnesium hydroxide (MILK OF MAGNESIA) suspension 30 mL  30 mL Oral Daily PRN Derrill Center, NP       multivitamin with minerals tablet 1 tablet  1 tablet Oral Daily Derrill Center, NP   1 tablet at 04/25/21 1329   OLANZapine (ZYPREXA) tablet 10 mg  10 mg Oral QHS Damita Dunnings B, MD       ondansetron (ZOFRAN-ODT) disintegrating tablet 4 mg  4 mg Oral Q6H PRN Derrill Center, NP       thiamine (B-1) injection 100 mg  100 mg Intramuscular Once Derrill Center, NP       thiamine tablet 100 mg  100 mg Oral Daily Derrill Center, NP   100 mg at 04/25/21 1329   traZODone (DESYREL) tablet 50 mg  50 mg Oral QHS PRN Derrill Center, NP        Lab Results:  Results for orders placed or performed during the hospital encounter of 04/23/21 (from the past 48 hour(s))  TSH     Status: None   Collection Time: 04/24/21  6:09 AM  Result Value Ref Range   TSH 1.796 0.350 - 4.500 uIU/mL    Comment: Performed by a 3rd Generation assay with a functional sensitivity of <=0.01 uIU/mL. Performed at [facility], [place] [address] [place], [place] [address]     Blood Alcohol level:  Lab Results  Component Value Date   ETH <10 04/22/2021   ETH (H) 01/02/2008    295        LOWEST DETECTABLE LIMIT FOR SERUM ALCOHOL IS 11 mg/dL FOR MEDICAL PURPOSES ONLY    Metabolic Disorder Labs: Lab Results  Component Value Date   HGBA1C 4.4 (L) 04/22/2021    MPG 79.58 04/22/2021   Lab Results  Component Value Date   PROLACTIN 5.3 04/22/2021   Lab Results  Component Value Date   CHOL 212 (H) 04/22/2021   TRIG 235 (H) 04/22/2021   HDL 46 04/22/2021   CHOLHDL 4.6 04/22/2021   VLDL 47 (H) 04/22/2021   LDLCALC 119 (H) 04/22/2021   LDLCALC 68 01/22/2010    Physical Findings: AIMS: Facial and Oral Movements Muscles of Facial Expression: None, normal Lips and Perioral Area: None, normal Jaw: None, normal Tongue: None, normal,Extremity Movements Upper (arms, wrists, hands, fingers): None, normal Lower (legs, knees, ankles, toes): None, normal, Trunk Movements Neck, shoulders, hips: None, normal, Overall Severity Severity of abnormal movements (highest score from questions above): None, normal Incapacitation due to abnormal movements: None, normal Patient's awareness of abnormal movements (rate only patient's report): No Awareness, Dental Status Current problems with teeth and/or dentures?: No Does patient usually wear dentures?: No  CIWA:  CIWA-Ar Total: 1 COWS:     Musculoskeletal: Strength & Muscle Tone: within normal limits Gait & Station: normal Patient leans: N/A  Psychiatric Specialty Exam:  Presentation  General Appearance: Appropriate for Environment; Casual  Eye Contact:Fair  Speech:Clear and Coherent  Speech Volume:Normal  Handedness:Right   Mood and Affect  Mood:Irritable  Affect:Congruent   Thought Process  Thought Processes:Goal Directed  Descriptions of Associations:Circumstantial  Orientation:Full (Time, Place and Person)  Thought Content:Logical  History of Schizophrenia/Schizoaffective disorder:No data recorded Duration of Psychotic Symptoms:Greater than six months  Hallucinations:Hallucinations: Auditory Description of Command Hallucinations: a voice telling him to kill people  Ideas of Reference:None  Suicidal Thoughts:Suicidal Thoughts: No  Homicidal Thoughts:Homicidal Thoughts:  No   Sensorium  Memory:Immediate Good; Recent Fair  Judgment:-- (improving)  Insight:Shallow   Executive Functions  Concentration:Fair  Attention Span:Fair  [place]   Psychomotor Activity  Psychomotor Activity:Psychomotor Activity: Normal   Assets  Assets:Communication Skills; Resilience   Sleep  Sleep:Sleep: Fair    Physical Exam: Physical Exam HENT:     Head: Normocephalic and atraumatic.  Pulmonary:     Effort: Pulmonary effort is normal.  Neurological:     Mental Status: He is alert.   Review of Systems  Psychiatric/Behavioral:  Positive for hallucinations. Negative for suicidal ideas.   Blood pressure (!) 153/104, pulse 72, temperature 100.2 F (37.9 C), temperature source Oral, resp. rate 18, height 5\' 7"  (1.702 m), weight 66.1 kg, SpO2 98 %. Body mass index is 22.84 kg/m.   Treatment Plan Summary: Daily contact with patient to assess and evaluate symptoms and progress in treatment and Medication management Mr. Wilmore Nethery is a 36 yo with PPH of schizoaffective disorder, bipolar type. Patient appears to have some improvement regarding his psychosis and HI with current medication, will increase dosage and continue to encourage patient to interact on the unit.  Schizoaffective disorder, bipolar type ( R/ O Substance induced psychosis vs Bipolar disorder, depressed episode w/ psychotic features) - Increase Zyprexa to 10mg  QHS, intend to titrate up as tolerated   Etoh Use disorder No hx of seizures - CIWA - B1 100mg  daily   PRN -Tylenol 650mg  q6h, pain -Maalox 83ml q4h, indigestion -Atarax 25mg  TID, anxiety -Milk of Mag 77mL, constipation -Trazodone 50mg  QHS, insomnia  - Agitation Protocol: Zyprexa 10mg  q8h, Ativan 1 mg, Geodon 20mg  IM    PGY-2 Freida Busman, MD 04/25/2021, 3:25 PM
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
## 2021-04-25 NOTE — [facility] Suicide Risk Assessment (Addendum)
[facility] INPATIENT:  Family/Significant Other Suicide Prevention Education  Suicide Prevention Education:  Education Completed; Vesta Mixer,  (Mother) has been identified by the patient as the family member/significant other with whom the patient will be residing, and identified as the person(s) who will aid the patient in the event of a mental health crisis (suicidal ideations/suicide attempt).  With written consent from the patient, the family member/significant other has been provided the following suicide prevention education, prior to the and/or following the discharge of the patient.  The suicide prevention education provided includes the following: Suicide risk factors Suicide prevention and interventions National Suicide Hotline telephone number [facility] assessment telephone number [facility] Emergency Assistance 911 [facility] and/or Residential Mobile Crisis Unit telephone number  Request made of family/significant other to: Remove weapons (e.g., guns, rifles, knives), all items previously/currently identified as safety concern.   Remove drugs/medications (over-the-counter, prescriptions, illicit drugs), all items previously/currently identified as a safety concern.  The family member/significant other verbalizes understanding of the suicide prevention education information provided.  The family member/significant other agrees to remove the items of safety concern listed above.  Patient's mother states that patient has had SI "for years now" but she is not familiar with his recent HI. She further states that she has noticed that he is much more short tempered as of recently and that he has not been like that in the past. Denies any knowledge of access to firearms or large quantities of medicaiton.    Corky Crafts 04/25/2021, 12:52 PM

## 2021-04-25 NOTE — Progress Notes (Addendum)
D. Pt presented with a sad affect/ depressed mood- a little guarded upon approach. Per pt's self inventory, pt rated his depression, hopelessness and anxiety a 4/5/5, respectively. Pt endorsed having occasional HI thoughts, but says that he doesn't want to act on them. Pt reported that his goal was to work on "not having crazy thoughts by keeping my mind on other things".   A. Labs and vitals monitored.. Pt supported emotionally and encouraged to express concerns and ask questions.   R. Pt remains safe with 15 minute checks. Will continue POC.   04/25/21 1500  Psych Admission Type (Psych Patients Only)  Admission Status Voluntary  Psychosocial Assessment  Patient Complaints Anxiety  Eye Contact Brief  Facial Expression Flat  Affect Flat  Speech Logical/coherent;Soft  Interaction Guarded  Motor Activity Other (Comment) (WNL)  Appearance/Hygiene Unremarkable  Behavior Characteristics Cooperative;Appropriate to situation  Mood Pleasant  Aggressive Behavior  Effect No apparent injury  Thought Process  Coherency WDL  Content WDL  Delusions None reported or observed  Perception WDL  Hallucination None reported or observed  Judgment Limited  Confusion None  Danger to Self  Current suicidal ideation? Denies  Danger to Others  Danger to Others None reported or observed

## 2021-04-25 NOTE — Progress Notes (Signed)
[facility] LCSW Progress Note   04/25/2021    10:20 AM  [name]   [id number]   Type of Contact and Topic:  Release of Information  .[name], patient, provided written consent for CSW team to coordinate aftercare at this time. CSW to have further conversations with patient regarding care in the community.   Patient interested in starting outpatient medication management and therapy. However, patient has pending legal order for hospital to relinquish custody to law enforcement, pending paperwork.   Signed:  Corky Crafts, MSW, LCSWA, LCASA 04/25/2021 10:20 AM

## 2021-04-25 NOTE — Group Note (Signed)
Recreation Therapy Group Note   Group Topic:Stress Management  Group Date: 04/25/2021 Start Time: 0932 End Time: 0945 Facilitators: Caroll Rancher, [place] Location: [address] Dayroom   Goal Area(s) Addresses:  Patient will identify positive stress management techniques. Patient will identify benefits of using stress management post d/c.  Group Description: Meditation.  LRT played a meditation on having gratitude.  The meditation spoke of being grateful not for physical things but for experiences that have been overcome, people and the simple things such as life and having a place to live.  Patients were to listen and follow as meditation played to fully engage.   Affect/Mood: N/A   Participation Level: Did not attend    Clinical Observations/Individualized Feedback:     Plan: Continue to engage patient in RT group sessions 2-3x/week.   Caroll Rancher, Antonietta Jewel 04/25/2021 12:48 PM

## 2021-04-25 NOTE — Group Note (Signed)
Date:  04/25/2021 Time:  9:32 AM  Group Topic/Focus:  Orientation:   The focus of this group is to educate the patient on the purpose and policies of crisis stabilization and provide a format to answer questions about their admission.  The group details unit policies and expectations of patients while admitted.    Participation Level:  Did not attend  Participation Quality:    Affect:    Cognitive:    Insight:   Engagement in Group:    Modes of Intervention:    Additional Comments:  Pt did not attend group.  Jaquita Rector 04/25/2021, 9:32 AM

## 2021-04-25 NOTE — [facility] IP Treatment Plan (Signed)
Interdisciplinary Treatment and Diagnostic Plan Update  04/25/2021 Time of Session: 9:10am  Mike Thompson MRN: [id number]  Principal Diagnosis: MDD (major depressive disorder)  Secondary Diagnoses: Principal Problem:   MDD (major depressive disorder) Active Problems:   TOBACCO ABUSE   Schizoaffective disorder, bipolar type (HCC)   Cannabis dependence, uncomplicated (HCC)   Alcohol dependence, uncomplicated (HCC)   Cocaine dependence ([place])   Current Medications:  Current Facility-Administered Medications  Medication Dose Route Frequency Provider Last Rate Last Admin   acetaminophen (TYLENOL) tablet 650 mg  650 mg Oral Q6H PRN Derrill Center, NP       alum & mag hydroxide-simeth (MAALOX/MYLANTA) 200-200-20 MG/5ML suspension 30 mL  30 mL Oral Q4H PRN Derrill Center, NP       hydrOXYzine (ATARAX) tablet 25 mg  25 mg Oral TID PRN Derrill Center, NP       hydrOXYzine (ATARAX) tablet 25 mg  25 mg Oral Q6H PRN Derrill Center, NP   25 mg at 04/25/21 1329   loperamide (IMODIUM) capsule 2-4 mg  2-4 mg Oral PRN Derrill Center, NP       LORazepam (ATIVAN) tablet 1 mg  1 mg Oral Q6H PRN Derrill Center, NP       OLANZapine zydis (ZYPREXA) disintegrating tablet 10 mg  10 mg Oral Q8H PRN Freida Busman, MD       And   LORazepam (ATIVAN) tablet 1 mg  1 mg Oral PRN Damita Dunnings B, MD       And   ziprasidone (GEODON) injection 20 mg  20 mg Intramuscular PRN Damita Dunnings B, MD       magnesium hydroxide (MILK OF MAGNESIA) suspension 30 mL  30 mL Oral Daily PRN Derrill Center, NP       multivitamin with minerals tablet 1 tablet  1 tablet Oral Daily Derrill Center, NP   1 tablet at 04/25/21 1329   OLANZapine (ZYPREXA) tablet 10 mg  10 mg Oral QHS Damita Dunnings B, MD       ondansetron (ZOFRAN-ODT) disintegrating tablet 4 mg  4 mg Oral Q6H PRN Derrill Center, NP       thiamine (B-1) injection 100 mg  100 mg Intramuscular Once Derrill Center, NP       thiamine tablet 100 mg  100 mg Oral  Daily Derrill Center, NP   100 mg at 04/25/21 1329   traZODone (DESYREL) tablet 50 mg  50 mg Oral QHS PRN Derrill Center, NP       PTA Medications: No medications prior to admission.    Patient Stressors: Financial difficulties   Marital or family conflict   Occupational concerns   Substance abuse    Patient Strengths: Motivation for treatment/growth  Physical Health  Supportive family/friends   Treatment Modalities: Medication Management, Group therapy, Case management,  1 to 1 session with clinician, Psychoeducation, Recreational therapy.   Physician Treatment Plan for Primary Diagnosis: MDD (major depressive disorder) Long Term Goal(s): Improvement in symptoms so as ready for discharge   Short Term Goals: Ability to identify changes in lifestyle to reduce recurrence of condition will improve Ability to verbalize feelings will improve Ability to disclose and discuss suicidal ideas Ability to demonstrate self-control will improve Ability to identify and develop effective coping behaviors will improve Ability to maintain clinical measurements within normal limits will improve Compliance with prescribed medications will improve Ability to identify triggers associated with substance abuse/mental health issues will  improve  Medication Management: Evaluate patient's response, side effects, and tolerance of medication regimen.  Therapeutic Interventions: 1 to 1 sessions, Unit Group sessions and Medication administration.  Evaluation of Outcomes: Not Met  Physician Treatment Plan for Secondary Diagnosis: Principal Problem:   MDD (major depressive disorder) Active Problems:   TOBACCO ABUSE   Schizoaffective disorder, bipolar type (HCC)   Cannabis dependence, uncomplicated (HCC)   Alcohol dependence, uncomplicated (HCC)   Cocaine dependence ([place])  Long Term Goal(s): Improvement in symptoms so as ready for discharge   Short Term Goals: Ability to identify changes in  lifestyle to reduce recurrence of condition will improve Ability to verbalize feelings will improve Ability to disclose and discuss suicidal ideas Ability to demonstrate self-control will improve Ability to identify and develop effective coping behaviors will improve Ability to maintain clinical measurements within normal limits will improve Compliance with prescribed medications will improve Ability to identify triggers associated with substance abuse/mental health issues will improve     Medication Management: Evaluate patient's response, side effects, and tolerance of medication regimen.  Therapeutic Interventions: 1 to 1 sessions, Unit Group sessions and Medication administration.  Evaluation of Outcomes: Not Met   RN Treatment Plan for Primary Diagnosis: MDD (major depressive disorder) Long Term Goal(s): Knowledge of disease and therapeutic regimen to maintain health will improve  Short Term Goals: Ability to remain free from injury will improve, Ability to verbalize frustration and anger appropriately will improve, Ability to demonstrate self-control, Ability to participate in decision making will improve, Ability to verbalize feelings will improve, Ability to disclose and discuss suicidal ideas, and Ability to identify and develop effective coping behaviors will improve  Medication Management: RN will administer medications as ordered by provider, will assess and evaluate patient's response and provide education to patient for prescribed medication. RN will report any adverse and/or side effects to prescribing provider.  Therapeutic Interventions: 1 on 1 counseling sessions, Psychoeducation, Medication administration, Evaluate responses to treatment, Monitor vital signs and CBGs as ordered, Perform/monitor CIWA, COWS, AIMS and Fall Risk screenings as ordered, Perform wound care treatments as ordered.  Evaluation of Outcomes: Not Met   LCSW Treatment Plan for Primary Diagnosis: MDD  (major depressive disorder) Long Term Goal(s): Safe transition to appropriate next level of care at discharge, Engage patient in therapeutic group addressing interpersonal concerns.  Short Term Goals: Engage patient in aftercare planning with referrals and resources, Increase social support, Increase emotional regulation, Facilitate acceptance of mental health diagnosis and concerns, Identify triggers associated with mental health/substance abuse issues, and Increase skills for wellness and recovery  Therapeutic Interventions: Assess for all discharge needs, 1 to 1 time with Social worker, Explore available resources and support systems, Assess for adequacy in community support network, Educate family and significant other(s) on suicide prevention, Complete Psychosocial Assessment, Interpersonal group therapy.  Evaluation of Outcomes: Not Met   Progress in Treatment: Attending groups: No. Participating in groups: No. Taking medication as prescribed: Yes. Toleration medication: Yes. Family/Significant other contact made: Yes, individual(s) contacted:  Mother  Patient understands diagnosis: Yes. Discussing patient identified problems/goals with staff: Yes. Medical problems stabilized or resolved: Yes. Denies suicidal/homicidal ideation: Yes. Issues/concerns per patient self-inventory: No.   New problem(s) identified: No, Describe:  None   New Short Term/Long Term Goal(s): medication stabilization, elimination of SI thoughts, development of comprehensive mental wellness plan.   Patient Goals: "To get on medications and to socialize more"   Discharge Plan or Barriers: Patient recently admitted. CSW will continue to follow and assess  for appropriate referrals and possible discharge planning.   Reason for Continuation of Hospitalization: Anxiety Depression Hallucinations Medication stabilization  Estimated Length of Stay: 3 to 5 days    Scribe for Treatment Team: Darleen Crocker,  Latanya Presser 04/25/2021 2:19 PM

## 2021-04-25 NOTE — Progress Notes (Signed)
D- Patient alert and oriented x4. Patient in stable mood.  Denies SI,HI, and AVH. Pt had minimal interaction with staff and peers. Pt denied pain or any complaints.    A- Scheduled medications administered to patient, per MD orders.Routine safety checks conducted every 15 minutes.  Patient informed to notify staff with problems or concerns.   R- Patient compliant with medications and verbalized understanding treatment plan. Patient receptive, calm, and cooperative.     04/24/21 2135  Psych Admission Type (Psych Patients Only)  Admission Status Voluntary  Psychosocial Assessment  Patient Complaints None  Eye Contact Brief  Facial Expression Flat  Affect Flat  Speech Logical/coherent;Soft  Interaction Guarded  Motor Activity Other (Comment) (WNL)  Appearance/Hygiene Unremarkable  Behavior Characteristics Cooperative;Appropriate to situation  Mood Pleasant  Thought Process  Coherency WDL  Content WDL  Delusions None reported or observed  Perception WDL  Hallucination None reported or observed  Judgment Limited  Confusion None  Danger to Self  Current suicidal ideation? Denies  Danger to Others  Danger to Others None reported or observed

## 2021-04-26 NOTE — Progress Notes (Signed)
[facility] Group Notes:  (Nursing/MHT/Case Management/Adjunct)  Date:  04/26/2021  Time:  2015  Type of Therapy:   wrap up group  Participation Level:  Active  Participation Quality:  Appropriate, Attentive, Sharing, and Supportive  Affect:  Depressed  Cognitive:  Alert  Insight:  Improving  Engagement in Group:  Engaged  Modes of Intervention:  Clarification, Education, and Support  Summary of Progress/Problems: Positive thinking and positive change were discussed.   Mike Thompson 04/26/2021, 9:24 PM

## 2021-04-26 NOTE — Progress Notes (Signed)
°   04/26/21 0200  Psych Admission Type (Psych Patients Only)  Admission Status Voluntary  Psychosocial Assessment  Patient Complaints Anxiety  Eye Contact Brief  Facial Expression Flat  Affect Flat  Speech Logical/coherent;Soft  Interaction Guarded  Motor Activity Other (Comment) (WNL)  Appearance/Hygiene Unremarkable  Behavior Characteristics Cooperative;Appropriate to situation  Mood Anxious  Aggressive Behavior  Effect No apparent injury  Thought Process  Coherency WDL  Content WDL  Delusions None reported or observed  Perception WDL  Hallucination None reported or observed  Judgment Limited  Confusion None  Danger to Self  Current suicidal ideation? Denies  Danger to Others  Danger to Others None reported or observed

## 2021-04-26 NOTE — Progress Notes (Signed)
[facility] MD Progress Note  04/26/2021 11:00 AM Mike Thompson  MRN:  [id number] Subjective:   Mike Thompson is a 35 yo patient w/ a PPH of schizoaffective disorder, bipolar type who presented to [facility] voluntarily endorsing HI.   Case was discussed in the multidisciplinary team. MAR was reviewed and patient was compliant with medications.  He did not require any PRN's for agitation.     Psychiatric Team made the following recommendations yesterday: - Increase Zyprexa to 10mg  QHS, intend to titrate up as tolerated   Objectively patient still remains very guarded and hypervigilant. Patient has been making attempts to go to group but does not stay for sessions.  Subjectively, patient reports that he slept very well last night and awoke this AM feeling that his mind was more clear. Patient reports that he did have a hypnagogic hallucination last night, but otherwise is not having hallucinations. Patient reports now that his mind his clearer he is not having AH this AM. Patient also denies HI and SI. Patient recalls conversation yesterday and endorses that he want to stay alive for his daughter and recalls learning yesterday that dying before his daughter turns 50 puts her at increased risk for mental health disease.   Patient and provider discussed patient's hypervigilance and as it describes is "paranoia"  being 2/2 to his later childhood and early adolescent years being in the prison system. Patient endorsed that he has been told before that he acts more like a 36 yo than and adult, and he does believe that this may be related to the fact that he went to prison as 36. Patient described himself as being "spontaneous, smart, and resourceful." Patient and provider discussed patient's hx of spontaneity and how this had led to his constantly worrying that he will return to jail. Patient and provider discussed that for patient to mentally develop beyond 36 years old he will have to learn to accept  consequences for his actions and try not to repeat them in the future. Patient given assignment to think about his life over the next 5, 81, 15 years.  Principal Problem: MDD (major depressive disorder) Diagnosis: Principal Problem:   MDD (major depressive disorder) Active Problems:   TOBACCO ABUSE   Schizoaffective disorder, bipolar type (HCC)   Cannabis dependence, uncomplicated (HCC)   Alcohol dependence, uncomplicated ([place])   Cocaine dependence ([place])  Total Time spent with patient: 20 minutes  Past Psychiatric History: See H&P  Past Medical History:  Past Medical History:  Diagnosis Date   Bennett's fracture of base of metacarpal of right thumb 09/16/2014   Schizophrenia ([place])     Past Surgical History:  Procedure Laterality Date   CLOSED REDUCTION FINGER WITH PERCUTANEOUS PINNING Right 09/26/2014   Procedure: CLOSED REDUCTION FINGER WITH PERCUTANEOUS PINNING RIGHT THUMB BENNETT'S FRACTURE;  Surgeon: Leanora Cover, MD;  Location: [place];  Service: Orthopedics;  Laterality: Right;  block with sedation   NO PAST SURGERIES     Family History: History reviewed. No pertinent family history. Family Psychiatric  History:  See H&P Social History:  Social History   Substance and Sexual Activity  Alcohol Use Yes   Alcohol/week: 18.0 standard drinks   Types: 14 Cans of beer, 4 Shots of liquor per week     Social History   Substance and Sexual Activity  Drug Use Yes   Types: Marijuana    Social History   Socioeconomic History   Marital status: Single    Spouse name: Not on  file   Number of children: Not on file   Years of education: Not on file   Highest education level: Not on file  Occupational History   Not on file  Tobacco Use   Smoking status: Some Days    Packs/day: 0.25    Years: 5.00    Pack years: 1.25    Types: Cigarettes   Smokeless tobacco: Never   Tobacco comments:    2 cig./day  Vaping Use   Vaping Use: Never used  Substance and  Sexual Activity   Alcohol use: Yes    Alcohol/week: 18.0 standard drinks    Types: 14 Cans of beer, 4 Shots of liquor per week   Drug use: Yes    Types: Marijuana   Sexual activity: Not on file  Other Topics Concern   Not on file  Social History Narrative   Not on file   Social Determinants of Health   Financial Resource Strain: Not on file  Food Insecurity: Not on file  Transportation Needs: Not on file  Physical Activity: Not on file  Stress: Not on file  Social Connections: Not on file   Additional Social History:                         Sleep: Fair  Appetite:  Good  Current Medications: Current Facility-Administered Medications  Medication Dose Route Frequency Provider Last Rate Last Admin   acetaminophen (TYLENOL) tablet 650 mg  650 mg Oral Q6H PRN Derrill Center, NP       alum & mag hydroxide-simeth (MAALOX/MYLANTA) 200-200-20 MG/5ML suspension 30 mL  30 mL Oral Q4H PRN Derrill Center, NP       hydrOXYzine (ATARAX) tablet 25 mg  25 mg Oral TID PRN Derrill Center, NP       hydrOXYzine (ATARAX) tablet 25 mg  25 mg Oral Q6H PRN Derrill Center, NP   25 mg at 04/25/21 1329   loperamide (IMODIUM) capsule 2-4 mg  2-4 mg Oral PRN Derrill Center, NP       LORazepam (ATIVAN) tablet 1 mg  1 mg Oral Q6H PRN Derrill Center, NP       OLANZapine zydis (ZYPREXA) disintegrating tablet 10 mg  10 mg Oral Q8H PRN Damita Dunnings B, MD       And   LORazepam (ATIVAN) tablet 1 mg  1 mg Oral PRN Damita Dunnings B, MD       And   ziprasidone (GEODON) injection 20 mg  20 mg Intramuscular PRN Damita Dunnings B, MD       magnesium hydroxide (MILK OF MAGNESIA) suspension 30 mL  30 mL Oral Daily PRN Derrill Center, NP       multivitamin with minerals tablet 1 tablet  1 tablet Oral Daily Derrill Center, NP   1 tablet at 04/26/21 0825   OLANZapine (ZYPREXA) tablet 10 mg  10 mg Oral QHS Damita Dunnings B, MD   10 mg at 04/25/21 2132   ondansetron (ZOFRAN-ODT) disintegrating tablet 4 mg  4  mg Oral Q6H PRN Derrill Center, NP       thiamine (B-1) injection 100 mg  100 mg Intramuscular Once Derrill Center, NP       thiamine tablet 100 mg  100 mg Oral Daily Derrill Center, NP   100 mg at 04/26/21 0825   traZODone (DESYREL) tablet 50 mg  50 mg Oral QHS PRN Ricky Ala  N, NP        Lab Results: No results found for this or any previous visit (from the past 48 hour(s)).  Blood Alcohol level:  Lab Results  Component Value Date   ETH <10 04/22/2021   ETH (H) 01/02/2008    295        LOWEST DETECTABLE LIMIT FOR SERUM ALCOHOL IS 11 mg/dL FOR MEDICAL PURPOSES ONLY    Metabolic Disorder Labs: Lab Results  Component Value Date   HGBA1C 4.4 (L) 04/22/2021   MPG 79.58 04/22/2021   Lab Results  Component Value Date   PROLACTIN 5.3 04/22/2021   Lab Results  Component Value Date   CHOL 212 (H) 04/22/2021   TRIG 235 (H) 04/22/2021   HDL 46 04/22/2021   CHOLHDL 4.6 04/22/2021   VLDL 47 (H) 04/22/2021   LDLCALC 119 (H) 04/22/2021   LDLCALC 68 01/22/2010    Physical Findings: AIMS: Facial and Oral Movements Muscles of Facial Expression: None, normal Lips and Perioral Area: None, normal Jaw: None, normal Tongue: None, normal,Extremity Movements Upper (arms, wrists, hands, fingers): None, normal Lower (legs, knees, ankles, toes): None, normal, Trunk Movements Neck, shoulders, hips: None, normal, Overall Severity Severity of abnormal movements (highest score from questions above): None, normal Incapacitation due to abnormal movements: None, normal Patient's awareness of abnormal movements (rate only patient's report): No Awareness, Dental Status Current problems with teeth and/or dentures?: No Does patient usually wear dentures?: No  CIWA:  CIWA-Ar Total: 1 COWS:     Musculoskeletal: Strength & Muscle Tone: within normal limits Gait & Station: normal Patient leans: N/A  Psychiatric Specialty Exam:  Presentation  General Appearance: Appropriate for  Environment  Eye Contact:Fair  Speech:Clear and Coherent  Speech Volume:Normal  Handedness:Right   Mood and Affect  Mood:-- (less irritable)  Affect:Congruent   Thought Process  Thought Processes:Coherent  Descriptions of Associations:Intact  Orientation:Full (Time, Place and Person)  Thought Content:Logical  History of Schizophrenia/Schizoaffective disorder:No data recorded Duration of Psychotic Symptoms:Less than six months  Hallucinations:Hallucinations: None  Ideas of Reference:None  Suicidal Thoughts:Suicidal Thoughts: No  Homicidal Thoughts:Homicidal Thoughts: No   Sensorium  Memory:Immediate Good; Recent Good; Remote Good  Judgment:-- (Improving)  Insight:-- (Improving)   Executive Functions  Concentration:Good  Attention Span:Good  Recall:Fair  Fund of Knowledge:Good  Language:Good   Psychomotor Activity  Psychomotor Activity:Psychomotor Activity: Normal   Assets  Assets:Communication Skills; Resilience; Housing   Sleep  Sleep:Sleep: Fair    Physical Exam: Physical Exam HENT:     Head: Normocephalic and atraumatic.  Pulmonary:     Effort: Pulmonary effort is normal.  Neurological:     Mental Status: He is alert and oriented to person, place, and time.   Review of Systems  Psychiatric/Behavioral:  Negative for suicidal ideas. The patient is nervous/anxious. The patient does not have insomnia.   Blood pressure (!) 128/97, pulse 69, temperature 98.1 F (36.7 C), temperature source Oral, resp. rate 20, height 5\' 7"  (1.702 m), weight 66.1 kg, SpO2 100 %. Body mass index is 22.84 kg/m.   Treatment Plan Summary: Daily contact with patient to assess and evaluate symptoms and progress in treatment and Medication management Mr. Webb SilversmithMark Bucklin is a 36 yo with PPH of schizoaffective disorder, bipolar type. Patient has endorsed either hypnopompic or hypnagogic hallucinations for the past 2 days, but otherwise no VH. Patient denied  AH this AM and endorses significant decrease in HI this AM. Will continue to monitor as he was endorsing HI at times yesterday, and  today is patient's first day after receiving 10mg  Zyprexa.   Schizoaffective disorder, bipolar type ( R/ O Substance induced psychosis vs Bipolar disorder, depressed episode w/ psychotic features) - Continue Zyprexa to 10mg  QHS, intend to titrate up as tolerated   Etoh Use disorder No hx of seizures - CIWA - B1 100mg  daily   PRN -Tylenol 650mg  q6h, pain -Maalox 100ml q4h, indigestion -Atarax 25mg  TID, anxiety -Milk of Mag 5mL, constipation -Trazodone 50mg  QHS, insomnia  - Agitation Protocol: Zyprexa 10mg  q8h, Ativan 1 mg, Geodon 20mg  IM      PGY-2 Freida Busman, MD 04/26/2021, 11:00 AM

## 2021-04-26 NOTE — Progress Notes (Signed)
D:  Patient denied SI and HI, contracts for safety.  Denied A/V hallucinations.   A:  Medications administered per MD orders.  Emotional support and encouragement given patient. R:  Safety maintained with 15 minute checks.  

## 2021-04-26 NOTE — Progress Notes (Addendum)
°  On assessment, pt appears to be sad.  Pt denies being anxious or depressed.  Pt denies SI/HI and verbally contracts for safety.  Pt also denies AVH.  Pt reports that his friends made him aware of his behavior.  Pt states, "I am here to get help to get back on meds."  Pt further stated, "My mind was clearer with no racing thoughts this morning."  Medication administered.  Pt is safe on unit with Q 15 minute safety checks.   04/26/21 2100  Psych Admission Type (Psych Patients Only)  Admission Status Voluntary  Psychosocial Assessment  Patient Complaints None  Eye Contact Fair  Facial Expression Flat  Affect Flat;Sad  Speech Logical/coherent;Soft  Interaction Guarded;Forwards little  Motor Activity Other (Comment) (WNL)  Appearance/Hygiene Unremarkable  Behavior Characteristics Cooperative;Calm  Mood Pleasant;Sad  Thought Process  Coherency WDL  Content WDL  Delusions None reported or observed  Perception WDL  Hallucination None reported or observed  Judgment Limited  Confusion None  Danger to Self  Current suicidal ideation? Denies  Danger to Others  Danger to Others None reported or observed

## 2021-04-26 NOTE — Plan of Care (Signed)
  Problem: Education: Goal: Emotional status will improve Outcome: Progressing Goal: Mental status will improve Outcome: Progressing   Problem: Activity: Goal: Sleeping patterns will improve Outcome: Progressing   

## 2021-04-26 NOTE — Plan of Care (Signed)
Nurse discussed anxiety and coping skills with patient. 

## 2021-04-27 DIAGNOSIS — F1220 Cannabis dependence, uncomplicated: Secondary | ICD-10-CM

## 2021-04-27 DIAGNOSIS — F250 Schizoaffective disorder, bipolar type: Principal | ICD-10-CM

## 2021-04-27 DIAGNOSIS — F1020 Alcohol dependence, uncomplicated: Secondary | ICD-10-CM

## 2021-04-27 DIAGNOSIS — F17200 Nicotine dependence, unspecified, uncomplicated: Secondary | ICD-10-CM

## 2021-04-27 MED ORDER — OLANZAPINE 10 MG PO TABS: 10.0000 mg | ORAL_TABLET | Freq: Every day | ORAL | 0 refills | Status: DC

## 2021-04-27 NOTE — Plan of Care (Signed)
Discharge note  Patient verbalizes readiness for discharge. Follow up plan explained, AVS, Transition record and SRA given. Prescriptions and teaching provided. Belongings returned and signed for. Suicide safety plan completed and signed. Patient verbalizes understanding. Patient denies SI/HI and assures this Probation officer they will seek assistance should that change. Patient discharged to search room where police were waiting.

## 2021-04-27 NOTE — BHH Counselor (Signed)
Pt was given a folder of homeless resources that included shelters, boarding houses, low-cost housing, food pantries, GoodRx card, and crisis resources.    Ryver Zadrozny, LCSWA Clinicial Social Worker [facility]  

## 2021-04-27 NOTE — BHH Group Notes (Signed)
Spiritual care group on grief and loss facilitated by chaplain Katy Indiana Pechacek, BCC   Group Goal:   Support / Education around grief and loss   Members engage in facilitated group support and psycho-social education.   Group Description:   Following introductions and group rules, group members engaged in facilitated group dialog and support around topic of loss, with particular support around experiences of loss in their lives. Group Identified types of loss (relationships / self / things) and identified patterns, circumstances, and changes that precipitate losses. Reflected on thoughts / feelings around loss, normalized grief responses, and recognized variety in grief experience. Group noted Worden's four tasks of grief in discussion.   Group drew on Adlerian / Rogerian, narrative, MI,   Patient Progress: Did not attend.  

## 2021-04-27 NOTE — Group Note (Signed)
Recreation Therapy Group Note   Group Topic:Stress Management  Group Date: 04/27/2021 Start Time: 0930 End Time: 0945 Facilitators: Caroll Rancher, LRT,CTRS Location: [address] Dayroom   Goal Area(s) Addresses:  Patient will actively participate in stress management techniques presented during session.  Patient will successfully identify benefit of practicing stress management post d/c.   Group Description: Guided Imagery. LRT provided education, instruction, and demonstration on practice of visualization via guided imagery. Patient was asked to participate in the technique introduced during session. LRT debriefed including topics of mindfulness, stress management and specific scenarios each patient could use these techniques. Patients were given suggestions of ways to access scripts post d/c and encouraged to explore Youtube and other apps available on smartphones, tablets, and computers.   Affect/Mood: N/A   Participation Level: Did not attend    Clinical Observations/Individualized Feedback:     Plan: Continue to engage patient in RT group sessions 2-3x/week.   Caroll Rancher, LRT,CTRS 04/27/2021 11:17 AM

## 2021-04-27 NOTE — BHH Counselor (Signed)
CSW contacted [facility] [phone] and notified them that pt will be discharged from [facility] at 10:30am.  Toney Reil, [place] Worker [place]

## 2021-04-27 NOTE — Plan of Care (Signed)
°  Problem: Education: Goal: Knowledge of [facility] Education information/materials will improve Outcome: Progressing Goal: Verbalization of understanding the information provided will improve Outcome: Progressing   Problem: Activity: Goal: Interest or engagement in activities will improve Outcome: Progressing   

## 2021-04-27 NOTE — Progress Notes (Addendum)
[facility] Adult Case Management Discharge Plan :  Will you be returning to the same living situation after discharge:  No. To jail due to duty to disclose At discharge, do you have transportation home?: Yes,  [employer] Do you have the ability to pay for your medications: Yes,  private insurance  Release of information consent forms completed and sent to medical records;  Patient's signature obtained prior to discharge.  Patient to Follow up at:  Follow-up Information     [facility] Follow up.   Why: You may call to schedule an appointment with this provider for therapy and medication management services. Contact information: [address]  [address] [place] [address] [phone]         [facility]. Go on 05/04/2021.   Specialty: Behavioral Health Why: You have a hospital follow up appointment for substance abuse intensive outpatient therapy services, and medication management on 05/04/21 at 11:00 am. This appointment will be held in person. Contact information: [address] [place] [address] [phone]                 Next level of care provider has access to [facility] Link:no  Safety Planning and Suicide Prevention discussed: Yes,  mother     Has patient been referred to the Quitline?: Patient refused referral  Patient has been referred for addiction treatment: Yes  Felizardo Hoffmann, LCSWA 04/27/2021, 9:53 AM

## 2021-04-27 NOTE — Discharge Summary (Signed)
Physician Discharge Summary Note  Patient:  Mike Thompson is an 36 y.o., male MRN:  [id number] DOB:  07-Jan-1986 Patient phone:  [phone] (home)  Patient address:   [place] [address],  Total Time spent with patient: 15 minutes  Date of Admission:  04/23/2021 Date of Discharge: 04/27/2021  Reason for Admission:  Endorsing HI with command AH.  Principal Problem: MDD (major depressive disorder) Discharge Diagnoses: Principal Problem:   MDD (major depressive disorder) Active Problems:   TOBACCO ABUSE   Schizoaffective disorder, bipolar type (HCC)   Cannabis dependence, uncomplicated (HCC)   Alcohol dependence, uncomplicated (HCC)   Cocaine dependence ([place])   Past Psychiatric History: Schizoaffective disorder, bipolar type. Was a patient at [facility] until 2021. Patient reports he was compliant until he came for appt and the office was closed. Patient recalls Zyprexa being his last medication, and doing well on this. Patient also recalls Risperdal (failed) and confirms having tried Abilify (does not remember outcome) and Depakote.   Endorses and inpatient stay at [facility] in the past?, but otherwise no hospitalizations.  Past Medical History:  Past Medical History:  Diagnosis Date   Bennett's fracture of base of metacarpal of right thumb 09/16/2014   Schizophrenia ([place])     Past Surgical History:  Procedure Laterality Date   CLOSED REDUCTION FINGER WITH PERCUTANEOUS PINNING Right 09/26/2014   Procedure: CLOSED REDUCTION FINGER WITH PERCUTANEOUS PINNING RIGHT THUMB BENNETT'S FRACTURE;  Surgeon: Leanora Cover, MD;  Location: [facility];  Service: Orthopedics;  Laterality: Right;  block with sedation   NO PAST SURGERIES     Family History: History reviewed. No pertinent family history. Family Psychiatric  History: Maternal Uncle: Schizophrenia, Maternal 1st cousins (2): Schizophrenia  Social History:  Social History   Substance and Sexual  Activity  Alcohol Use Yes   Alcohol/week: 18.0 standard drinks   Types: 14 Cans of beer, 4 Shots of liquor per week     Social History   Substance and Sexual Activity  Drug Use Yes   Types: Marijuana    Social History   Socioeconomic History   Marital status: Single    Spouse name: Not on file   Number of children: Not on file   Years of education: Not on file   Highest education level: Not on file  Occupational History   Not on file  Tobacco Use   Smoking status: Some Days    Packs/day: 0.25    Years: 5.00    Pack years: 1.25    Types: Cigarettes   Smokeless tobacco: Never   Tobacco comments:    2 cig./day  Vaping Use   Vaping Use: Never used  Substance and Sexual Activity   Alcohol use: Yes    Alcohol/week: 18.0 standard drinks    Types: 14 Cans of beer, 4 Shots of liquor per week   Drug use: Yes    Types: Marijuana   Sexual activity: Not on file  Other Topics Concern   Not on file  Social History Narrative   Not on file   Social Determinants of Health   Financial Resource Strain: Not on file  Food Insecurity: Not on file  Transportation Needs: Not on file  Physical Activity: Not on file  Stress: Not on file  Social Connections: Not on file    Hospital Course:  Mr. Lewin Danger is a 36 yo with PPH of schizoaffective disorder, bipolar type. Patient appeared very depressed and troubled by his command [place] instructing  him to kill others. Patient endorsed general HI 2/2 to his AH. Patient was bothered and endorsed a hx of relief with Zyprexa. Patient was restarted on Zyprexa 5mg  and titrated up to 10mg . Patient did well on  the medication as he initially endorse decreased psychosis symptoms and then complete resolution. Patient was not isolative after starting medication. Patient had no behavior concerns during hospitalization and did not require PRN medication for agitation. Provider also spent session time with patient talking about his hypervigilance 2/2 to  time in penitentiary . Patient had endorsed that he would rather die than return to prison or jail. Patient and provider discussed that patient felt a will to live and provide his daughter a good life. Patient was receptive to therapy and endorsed understanding that he will have to continue to work to develop into the person he wants to be for his father. Patient endorsed that his hx of impulsivity was a large contribution to the behavior that has upset him. Patient endorsed plan to remain compliant with his medications to help prevent the impulsivity as well as the AH and HI. At time of discharge patient was sleeping well, eating well and denying SI, HI, and AVH.   Physical Findings: AIMS: Facial and Oral Movements Muscles of Facial Expression: None, normal Lips and Perioral Area: None, normal Jaw: None, normal Tongue: None, normal,Extremity Movements Upper (arms, wrists, hands, fingers): None, normal Lower (legs, knees, ankles, toes): None, normal, Trunk Movements Neck, shoulders, hips: None, normal, Overall Severity Severity of abnormal movements (highest score from questions above): None, normal Incapacitation due to abnormal movements: None, normal Patient's awareness of abnormal movements (rate only patient's report): No Awareness, Dental Status Current problems with teeth and/or dentures?: No Does patient usually wear dentures?: No  CIWA:  CIWA-Ar Total: 1 COWS:     Musculoskeletal: Strength & Muscle Tone: within normal limits Gait & Station: normal Patient leans: N/A   Psychiatric Specialty Exam:  Presentation  General Appearance: Appropriate for Environment  Eye Contact:Fair  Speech:Clear and Coherent  Speech Volume:Normal  Handedness:Right   Mood and Affect  Mood:-- (less irritable)  Affect:Congruent   Thought Process  Thought Processes:Coherent  Descriptions of Associations:Intact  Orientation:Full (Time, Place and Person)  Thought  Content:Logical  History of Schizophrenia/Schizoaffective disorder:No data recorded Duration of Psychotic Symptoms:Less than six months  Hallucinations:Hallucinations: None  Ideas of Reference:None  Suicidal Thoughts:Suicidal Thoughts: No  Homicidal Thoughts:Homicidal Thoughts: No   Sensorium  Memory:Immediate Good; Recent Good; Remote Good  Judgment:-- (Improving)  Insight:-- (Improving)   Executive Functions  Concentration:Good  Attention Span:Good  [place] of Knowledge:Good  Language:Good   Psychomotor Activity  Psychomotor Activity:Psychomotor Activity: Normal   Assets  Assets:Communication Skills; Resilience; Housing   Sleep  Sleep:Sleep: Fair    Physical Exam: Physical Exam Constitutional:      Appearance: Normal appearance.  HENT:     Head: Normocephalic and atraumatic.  Pulmonary:     Effort: Pulmonary effort is normal.  Neurological:     Mental Status: He is alert and oriented to person, place, and time.   Review of Systems  Psychiatric/Behavioral:  Negative for hallucinations and suicidal ideas. The patient does not have insomnia.   Blood pressure (!) 129/99, pulse 77, temperature 98.3 F (36.8 C), temperature source Oral, resp. rate 16, height 5\' 7"  (1.702 m), weight 66.1 kg, SpO2 100 %. Body mass index is 22.84 kg/m.   Social History   Tobacco Use  Smoking Status Some Days   Packs/day: 0.25  Years: 5.00   Pack years: 1.25   Types: Cigarettes  Smokeless Tobacco Never  Tobacco Comments   2 cig./day   Tobacco Cessation:  N/A, patient does not currently use tobacco products   Blood Alcohol level:  Lab Results  Component Value Date   ETH <10 04/22/2021   ETH (H) 01/02/2008    295        LOWEST DETECTABLE LIMIT FOR SERUM ALCOHOL IS 11 mg/dL FOR MEDICAL PURPOSES ONLY    Metabolic Disorder Labs:  Lab Results  Component Value Date   HGBA1C 4.4 (L) 04/22/2021   MPG 79.58 04/22/2021   Lab Results  Component  Value Date   PROLACTIN 5.3 04/22/2021   Lab Results  Component Value Date   CHOL 212 (H) 04/22/2021   TRIG 235 (H) 04/22/2021   HDL 46 04/22/2021   CHOLHDL 4.6 04/22/2021   VLDL 47 (H) 04/22/2021   LDLCALC 119 (H) 04/22/2021   [place] 68 01/22/2010    See Psychiatric Specialty Exam and Suicide Risk Assessment completed by Attending Physician prior to discharge.  Discharge destination:  Other:  [place] custody  Is patient on multiple antipsychotic therapies at discharge:  No   Has Patient had three or more failed trials of antipsychotic monotherapy by history:  No  Recommended Plan for Multiple Antipsychotic Therapies: NA   Allergies as of 04/27/2021   No Known Allergies      Medication List     TAKE these medications      Indication  OLANZapine 10 MG tablet Commonly known as: ZYPREXA Take 1 tablet (10 mg total) by mouth at bedtime.  Indication: Schizoaffective disorder        Follow-up Information     [facility] Follow up.   Why: You may call to schedule an appointment with this provider for therapy and medication management services. Contact information: [address]  [address] [place] [address] [phone]         [facility], [place]. Go on 05/04/2021.   Specialty: Behavioral Health Why: You have a hospital follow up appointment for substance abuse intensive outpatient therapy services, and medication management on 05/04/21 at 11:00 am. This appointment will be held in person. Contact information: [address] [place] [address] [phone]                 Follow-up recommendations:  Follow up recommendations: - Activity as tolerated. - Diet as recommended by PCP. - Keep all scheduled follow-up appointments as recommended.   Comments:  Patient is instructed to take all prescribed medications as recommended. Report any side effects or adverse reactions to your outpatient psychiatrist. Patient  is instructed to abstain from alcohol and illegal drugs while on prescription medications. In the event of worsening symptoms, patient is instructed to call the crisis hotline, 911, or go to the nearest emergency department for evaluation and treatment.    Signed:  PGY-2 Freida Busman, MD 04/27/2021, 5:20 PM

## 2021-04-27 NOTE — BHH Suicide Risk Assessment (Signed)
Suicide Risk Assessment  Discharge Assessment    [facility] Discharge Suicide Risk Assessment   Principal Problem: MDD (major depressive disorder) Discharge Diagnoses: Principal Problem:   MDD (major depressive disorder) Active Problems:   TOBACCO ABUSE   Schizoaffective disorder, bipolar type (HCC)   Cannabis dependence, uncomplicated (HCC)   Alcohol dependence, uncomplicated (HCC)   Cocaine dependence (HCC)  Mike Thompson is a 36 yo patient w/ a PPH of schizoaffective disorder, bipolar type who presented to [facility] voluntarily endorsing HI.  Patient reported that he has done well with Zyprexa in the past. Patient was restarted on Zyprexa and handled the medication well. Patient also received some therapy regarding his hypervigilance 2/2 to his hx in the penial system. Patient was very receptive to this therapy and endorsed that the medication did allow him to have a clear mind and led to cessation of his AH and HI. At time of discharge patient was endorsing good sleep, not SI, HI or AVH. Patient endorsed intention to remain complaint with his Zyprexa as he recognized that it was important to help remain less irritable and not having HI.  Patient did well on the unit, with no behavior episodes and did not require PRNs for agitation.  Total Time spent with patient: 15 minutes  Musculoskeletal: Strength & Muscle Tone: within normal limits Gait & Station: normal Patient leans: N/A  Psychiatric Specialty Exam  Presentation  General Appearance: Appropriate for Environment  Eye Contact:Fair  Speech:Clear and Coherent  Speech Volume:Normal  Handedness:Right   Mood and Affect  Mood:-- (less irritable)  Duration of Depression Symptoms: Greater than two weeks  Affect:Congruent   Thought Process  Thought Processes:Coherent  Descriptions of Associations:Intact  Orientation:Full (Time, Place and Person)  Thought Content:Logical  History of Schizophrenia/Schizoaffective  disorder:No data recorded Duration of Psychotic Symptoms:Less than six months  Hallucinations:Hallucinations: None  Ideas of Reference:None  Suicidal Thoughts:Suicidal Thoughts: No  Homicidal Thoughts:Homicidal Thoughts: No   Sensorium  Memory:Immediate Good; Recent Good; Remote Good  Judgment:-- (Improving)  Insight:-- (Improving)   Executive Functions  Concentration:Good  Attention Span:Good  Recall:Fair  Fund of Knowledge:Good  Language:Good   Psychomotor Activity  Psychomotor Activity:Psychomotor Activity: Normal   Assets  Assets:Communication Skills; Resilience; Housing   Sleep  Sleep:Sleep: Fair   Physical Exam: Physical Exam Constitutional:      Appearance: Normal appearance.  HENT:     Head: Normocephalic and atraumatic.  Pulmonary:     Effort: Pulmonary effort is normal.  Neurological:     Mental Status: He is alert and oriented to person, place, and time.   Review of Systems  Psychiatric/Behavioral:  Negative for hallucinations and suicidal ideas.   Blood pressure (!) 129/99, pulse 77, temperature 98.3 F (36.8 C), temperature source Oral, resp. rate 16, height 5\' 7"  (1.702 m), weight 66.1 kg, SpO2 100 %. Body mass index is 22.84 kg/m.  Mental Status Per Nursing Assessment::   On Admission:  Suicidal ideation indicated by others, Thoughts of violence towards others  Demographic Factors:  Male and Low socioeconomic status  Loss Factors: Legal issues  Historical Factors: Impulsivity  Risk Reduction Factors:   Responsible for children under 30 years of age, Sense of responsibility to family, and Living with another person, especially a relative  Continued Clinical Symptoms:  NA  Cognitive Features That Contribute To Risk:  None    Suicide Risk:  Minimal: No identifiable suicidal ideation.  Patients presenting with no risk factors but with morbid ruminations; may be classified as minimal risk based  on the severity of the  depressive symptoms   Follow-up Information     [facility] Follow up.   Why: You may call to schedule an appointment with this provider for therapy and medication management services. Contact information: [address] [place] [address] [phone]         [facility], [facility]. Go on 05/04/2021.   Specialty: Behavioral Health Why: You have a hospital follow up appointment for substance abuse intensive outpatient therapy services, and medication management on 05/04/21 at 11:00 am. This appointment will be held in person. Contact information: [address] [place] [address] [phone]                 Plan Of Care/Follow-up recommendations:  Follow up recommendations: - Activity as tolerated. - Diet as recommended by PCP. - Keep all scheduled follow-up appointments as recommended.    PGY-2 Bobbye Morton, MD 04/27/2021, 9:31 AM

## 2021-04-27 NOTE — Discharge Instructions (Signed)
Dear Mike Thompson,   Thank you so much for allowing [place] to be part of your care!  You were admitted to [facility] for schizoaffective disorder, bipolar type.    POST-HOSPITAL & CARE INSTRUCTIONS Please continue taking Zyprexa 10mg  nightly. Please let PCP/Specialists know of any changes that were made.  Please see medications section of this packet for any medication changes.   Follow UP   Please come to [facility]) during walk in hours for appointment with psychiatrist for further medication management and for therapists for therapy.    Walk in hours are 8-11 AM Monday through Thursday for medication management.Therapy walk in hours are Monday-Wednesday 8 AM-1PM.   It is first come, first -serve; it is best to arrive by 7:00 AM.   On Friday from 1 pm to 4 pm for therapy intake only. Please arrive by 12:00 pm as it is  first come, first -serve.    When you arrive please go upstairs for your appointment. If you are unsure of where to go, inform the front desk that you are here for a walk in appointment and they will assist you with directions upstairs.  Address: Saturday [address] in [place], [place] Ph: [phone]   Take care and be well!  -----------------------------------------------------------------------  [facility]  [address]  [place], [place]

## 2021-05-07 ENCOUNTER — Telehealth (HOSPITAL_COMMUNITY): Payer: Self-pay | Admitting: Emergency Medicine

## 2021-05-07 NOTE — BH Assessment (Signed)
Care Management - Follow Up [facility] Discharges   Patient has been placed in an inpatient psychiatric hospital [facility]) on 04-22-2021.

## 2022-11-18 ENCOUNTER — Other Ambulatory Visit: Payer: Self-pay

## 2022-11-18 ENCOUNTER — Ambulatory Visit (HOSPITAL_COMMUNITY)
Admission: EM | Admit: 2022-11-18 | Discharge: 2022-11-21 | Disposition: A | Discharge status: Home / Self Care | Payer: 59 | Attending: Psychiatry | Admitting: Psychiatry

## 2022-11-18 DIAGNOSIS — F250 Schizoaffective disorder, bipolar type: Secondary | ICD-10-CM | POA: Insufficient documentation

## 2022-11-18 DIAGNOSIS — F1290 Cannabis use, unspecified, uncomplicated: Secondary | ICD-10-CM | POA: Insufficient documentation

## 2022-11-18 DIAGNOSIS — R45850 Homicidal ideations: Secondary | ICD-10-CM | POA: Diagnosis not present

## 2022-11-18 LAB — POCT URINE DRUG SCREEN - MANUAL ENTRY (I-SCREEN)
POC Amphetamine UR: NOT DETECTED
POC Buprenorphine (BUP): NOT DETECTED
POC Cocaine UR: POSITIVE — AB
POC Marijuana UR: POSITIVE — AB
POC Methadone UR: NOT DETECTED
POC Methamphetamine UR: POSITIVE — AB
POC Morphine: NOT DETECTED
POC Oxazepam (BZO): NOT DETECTED
POC Oxycodone UR: NOT DETECTED
POC Secobarbital (BAR): NOT DETECTED

## 2022-11-18 LAB — LIPID PANEL
Cholesterol: 216 mg/dL — ABNORMAL HIGH (ref 0–200)
HDL: 66 mg/dL (ref >40)
LDL Cholesterol: 134 mg/dL — ABNORMAL HIGH (ref 0–99)
Total CHOL/HDL Ratio: 3.3 RATIO
Triglycerides: 80 mg/dL (ref <150)
VLDL: 16 mg/dL (ref 0–40)

## 2022-11-18 LAB — COMPREHENSIVE METABOLIC PANEL
ALT: 21 U/L (ref 0–44)
AST: 28 U/L (ref 15–41)
Albumin: 4.4 g/dL (ref 3.5–5.0)
Alkaline Phosphatase: 65 U/L (ref 38–126)
Anion gap: 16 — ABNORMAL HIGH (ref 5–15)
BUN: 6 mg/dL (ref 6–20)
CO2: 22 mmol/L (ref 22–32)
Calcium: 9.4 mg/dL (ref 8.9–10.3)
Chloride: 100 mmol/L (ref 98–111)
Creatinine, Ser: 0.86 mg/dL (ref 0.61–1.24)
GFR, Estimated: >60 mL/min (ref >60)
Glucose, Bld: 80 mg/dL (ref 70–99)
Potassium: 3.9 mmol/L (ref 3.5–5.1)
Sodium: 138 mmol/L (ref 135–145)
Total Bilirubin: 1.4 mg/dL — ABNORMAL HIGH (ref 0.3–1.2)
Total Protein: 7.6 g/dL (ref 6.5–8.1)

## 2022-11-18 LAB — TSH: TSH: 1.144 u[IU]/mL (ref 0.350–4.500)

## 2022-11-18 LAB — MAGNESIUM: Magnesium: 1.6 mg/dL — ABNORMAL LOW (ref 1.7–2.4)

## 2022-11-18 LAB — CBC WITH DIFFERENTIAL/PLATELET
Abs Immature Granulocytes: 0.03 10*3/uL (ref 0.00–0.07)
Basophils Absolute: 0 10*3/uL (ref 0.0–0.1)
Basophils Relative: 0 %
Eosinophils Absolute: 0 10*3/uL (ref 0.0–0.5)
Eosinophils Relative: 0 %
HCT: 46.2 % (ref 39.0–52.0)
Hemoglobin: 15.6 g/dL (ref 13.0–17.0)
Immature Granulocytes: 1 %
Lymphocytes Relative: 21 %
Lymphs Abs: 1.4 10*3/uL (ref 0.7–4.0)
MCH: 33.1 pg (ref 26.0–34.0)
MCHC: 33.8 g/dL (ref 30.0–36.0)
MCV: 98.1 fL (ref 80.0–100.0)
Monocytes Absolute: 0.6 10*3/uL (ref 0.1–1.0)
Monocytes Relative: 9 %
Neutro Abs: 4.6 10*3/uL (ref 1.7–7.7)
Neutrophils Relative %: 69 %
Platelets: 375 10*3/uL (ref 150–400)
RBC: 4.71 MIL/uL (ref 4.22–5.81)
RDW: 12.8 % (ref 11.5–15.5)
WBC: 6.6 10*3/uL (ref 4.0–10.5)
nRBC: 0 % (ref 0.0–0.2)

## 2022-11-18 LAB — ETHANOL: Alcohol, Ethyl (B): 61 mg/dL — ABNORMAL HIGH (ref <10)

## 2022-11-18 LAB — HEMOGLOBIN A1C
Hgb A1c MFr Bld: 4.7 % — ABNORMAL LOW (ref 4.8–5.6)
Mean Plasma Glucose: 88.19 mg/dL

## 2022-11-18 MED ORDER — ADULT MULTIVITAMIN W/MINERALS CH
1.0000 | ORAL_TABLET | Freq: Every day | ORAL | Status: DC
  Administered 2022-11-18 – 2022-11-20 (×3): 1 via ORAL
  Filled 2022-11-18 (×3): qty 1

## 2022-11-18 MED ORDER — LORAZEPAM 1 MG PO TABS: 1.0000 mg | ORAL_TABLET | Freq: Every day | ORAL | Status: DC

## 2022-11-18 MED ORDER — THIAMINE MONONITRATE 100 MG PO TABS
100.0000 mg | ORAL_TABLET | Freq: Every day | ORAL | Status: DC
  Administered 2022-11-19 – 2022-11-20 (×2): 100 mg via ORAL
  Filled 2022-11-18 (×2): qty 1

## 2022-11-18 MED ORDER — MAGNESIUM HYDROXIDE 400 MG/5ML PO SUSP: 30.0000 mL | Freq: Every day | ORAL | Status: DC | PRN

## 2022-11-18 MED ORDER — HYDROXYZINE HCL 25 MG PO TABS
25.0000 mg | ORAL_TABLET | Freq: Four times a day (QID) | ORAL | Status: DC | PRN
  Administered 2022-11-20: 25 mg via ORAL

## 2022-11-18 MED ORDER — LORAZEPAM 1 MG PO TABS
1.0000 mg | ORAL_TABLET | Freq: Four times a day (QID) | ORAL | Status: AC
  Administered 2022-11-18 – 2022-11-19 (×3): 1 mg via ORAL
  Filled 2022-11-18 (×3): qty 1

## 2022-11-18 MED ORDER — TRAZODONE HCL 50 MG PO TABS
50.0000 mg | ORAL_TABLET | Freq: Every evening | ORAL | Status: DC | PRN
  Administered 2022-11-18 – 2022-11-20 (×3): 50 mg via ORAL
  Filled 2022-11-18 (×3): qty 1

## 2022-11-18 MED ORDER — ALUM & MAG HYDROXIDE-SIMETH 200-200-20 MG/5ML PO SUSP: 30.0000 mL | ORAL | Status: DC | PRN

## 2022-11-18 MED ORDER — LORAZEPAM 1 MG PO TABS: 1.0000 mg | ORAL_TABLET | Freq: Four times a day (QID) | ORAL | Status: DC | PRN

## 2022-11-18 MED ORDER — ONDANSETRON 4 MG PO TBDP: 4.0000 mg | ORAL_TABLET | Freq: Four times a day (QID) | ORAL | Status: DC | PRN

## 2022-11-18 MED ORDER — LORAZEPAM 1 MG PO TABS
1.0000 mg | ORAL_TABLET | Freq: Three times a day (TID) | ORAL | Status: AC
  Administered 2022-11-19 – 2022-11-20 (×3): 1 mg via ORAL
  Filled 2022-11-18 (×3): qty 1

## 2022-11-18 MED ORDER — ACETAMINOPHEN 325 MG PO TABS: 650.0000 mg | ORAL_TABLET | Freq: Four times a day (QID) | ORAL | Status: DC | PRN

## 2022-11-18 MED ORDER — LOPERAMIDE HCL 2 MG PO CAPS: 2.0000 mg | ORAL_CAPSULE | ORAL | Status: DC | PRN

## 2022-11-18 MED ORDER — HYDROXYZINE HCL 25 MG PO TABS
25.0000 mg | ORAL_TABLET | Freq: Three times a day (TID) | ORAL | Status: DC | PRN
  Filled 2022-11-18: qty 1

## 2022-11-18 MED ORDER — THIAMINE HCL 100 MG/ML IJ SOLN
100.0000 mg | Freq: Once | INTRAMUSCULAR | Status: AC
  Administered 2022-11-18: 100 mg via INTRAMUSCULAR
  Filled 2022-11-18: qty 2

## 2022-11-18 MED ORDER — OLANZAPINE 5 MG PO TABS
5.0000 mg | ORAL_TABLET | Freq: Two times a day (BID) | ORAL | Status: DC
  Administered 2022-11-18 – 2022-11-20 (×5): 5 mg via ORAL
  Filled 2022-11-18 (×5): qty 1

## 2022-11-18 MED ORDER — LORAZEPAM 1 MG PO TABS
1.0000 mg | ORAL_TABLET | Freq: Two times a day (BID) | ORAL | Status: DC
  Administered 2022-11-20: 1 mg via ORAL
  Filled 2022-11-18: qty 1

## 2022-11-18 NOTE — ED Notes (Signed)
Pt A&O x 4, sleeping at present, no distress noted. Denies SI, HI or AVH.  Monitoring for safety.

## 2022-11-18 NOTE — ED Notes (Signed)
Pt sleeping@this time. Breathing even and unlabored. Will continue to monitor for safety 
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
## 2022-11-18 NOTE — Progress Notes (Signed)
LCSW Progress Note  [id number]   Mike Thompson  11/18/2022  3:46 PM  Description:   Inpatient Psychiatric Referral  Patient was recommended inpatient per Vernard Gambles, NP. There are no available beds at [facility], per [facility] Rona Ravens, RN. Patient was referred to the following out of network facilities:   Destination  Service Provider Address Phone Fax  [facility]  [address] [place], [place] [address] [phone]  [facility]  [address] [place] [address] [phone]  [facility]  [address] [place] [address] [phone]  [facility]  [id number] [place] [address] [phone]  [facility]  [address] [place], [place] [address] [phone]  [facility]  [address] [place] [address] [phone]  [facility]  [address] [place] [address] [phone]  [facility]  [address], [place] [address] [phone]  [facility]  [address] [place] [address] [phone]  [facility]  [address], [place] [address] [phone]  [facility]  [address], [place] [address] [phone]  [facility]  [address], [place] [address] [phone]  [facility]  [address] [place] [address] [phone]  [facility]  [address] [place] [address] [phone]  [facility]  [address], [place] [address] [phone]  [facility]   [address] [place], [place] [address] [phone]  [facility]  [address], [place] [address] [phone]  [facility]  [address], [place] [address] [phone]  [facility]  [address] [place] [address] [phone]  [facility]  [address] [place] [address] [phone]  [facility]  [address] [place] [address] [phone]  [facility]  [address] [place] [address] [phone]  [facility] [address]  [address] [place] [address] [phone]  [facility]  [address] [place], [place] [address] [phone]  [facility]  [address] [place] [address] [phone]    Situation ongoing, CSW to continue following and update chart as more information becomes available.      Cathie Beams, [place]  11/18/2022 3:46 PM

## 2022-11-18 NOTE — ED Provider Notes (Signed)
[facility] Continuous Assessment Admission H&P  Date: 11/18/22 Patient Name: Mike Thompson MRN: [id number] Chief Complaint: homicidal ideations   Diagnoses:  Final diagnoses:  Schizoaffective disorder, bipolar type [facility])  Homicidal ideations    HPI  patient presented to [facility] as a walk in unaccompanied with complaints of homicidal ideations  [name], 37 y.o., male patient seen face to face by this provider, consulted with Dr. Lucianne Muss; and chart reviewed on 11/18/22.  Per chart review patient has a past psychiatric history of schizoaffective disorder bipolar type.  He has no outpatient services in place.  He currently takes no medications.   Assessment note by Sydell Axon [facility] "Patient is a 37 year old male with a history of Schizoaffective Disorder, bipolar type (currently untreated) who presents voluntarily to [facility] for assessment.  Patient states that he is feeling homocidal towards others and has been for a few weeks.  He reports having "crazy thoughts about hurting people."  He reports AH, stating he hears male voices telling him to harm people.  He reports this is worsening and he came today to "get help.  It's why I'm here."  He admits he has been violent in the past, acting on what the voices have told him to do.  He has served time in jail for assault on "random people, police officers."   Patient does not identify anyone in particular he wants to harm and states that he could hurt "someone if he was to walk out the facility and got the wrong look."  He believes the trigger for this current episode was breaking up with his girlfriend two weeks ago.  He reports worsening symptoms during this two week period.  Patient has been admitted for inpatient treatment several times, most recently in 2022 at [facility].  He states he was taking zyprexa, which helped, for a while.  He discontinued medication in January 2024.  Patient admits to ETOH use, stating he  drinks 2-4 (40's) daily for years, with last drink last night.  He also uses THC daily and cocaine on occasion.  Patient denies SI and VH.  Treatment options were discussed and patient is agreeable with recommendation for inpatient psychiatric treatment."   During evaluation Bernest Nies is observed sitting in the assessment room in no acute distress.  He is alert/oriented x 4, cooperative, and fairly attentive.  He is guarded and makes fair eye contact.  He has normal speech.  He endorses some depression.  He endorses feelings of guilt, irritability, decreased appetite and sleep.  He has a dysphoric affect.  He endorses homicidal ideations  towards anyone that may look a specific way but he does not elaborate.  He is unable to contract for safety.  States, "that is why came here I do not want hurt anybody".  He denies any SI.  He denies visual hallucinations.  He endorses auditory hallucinations of hearing male voices that tell him to harm others.  He endorses  paranoia and feels that people are watching him and following him.  He does not appear to be responding to internal/external stimuli.  Discussed inpatient psychiatric admission and patient is in agreement.  Total Time spent with patient: 30 minutes  Musculoskeletal  Strength & Muscle Tone: within normal limits Gait & Station: normal Patient leans: N/A  Psychiatric Specialty Exam  Presentation General Appearance:  Casual  Eye Contact: Fair  Speech: Clear and Coherent; Normal Rate  Speech Volume: Normal  Handedness: Right  Mood and Affect  Mood: Anxious; Dysphoric  Affect: Congruent; Flat   Thought Process  Thought Processes: Coherent  Descriptions of Associations:Intact  Orientation:Full (Time, Place and Person)  Thought Content:Paranoid Ideation  Diagnosis of Schizophrenia or Schizoaffective disorder in past: Yes  Duration of Psychotic Symptoms: Greater than six months  Hallucinations:Hallucinations:  Auditory Description of Auditory Hallucinations: hears male voice that tells him to hurt people  Ideas of Reference:Paranoia  Suicidal Thoughts:Suicidal Thoughts: No  Homicidal Thoughts:Homicidal Thoughts: Yes, Active HI Active Intent and/or Plan: With Intent; Without Plan   Sensorium  Memory: Immediate Good; Recent Good; Remote Good  Judgment: Fair  Insight: Fair; Poor   Executive Functions  Concentration: Fair  Attention Span: Fair  Recall: [employer] of Knowledge: Fair  Language: Fair   Psychomotor Activity  Psychomotor Activity: Psychomotor Activity: Normal   Assets  Assets: Manufacturing systems engineer; Desire for Improvement; Physical Health; Social Support; Resilience   Sleep  Sleep: Sleep: Poor Number of Hours of Sleep: 0   Nutritional Assessment (For OBS and FBC admissions only) Has the patient had a weight loss or gain of 10 pounds or more in the last 3 months?: No Has the patient had a decrease in food intake/or appetite?: Yes Does the patient have dental problems?: No Does the patient have eating habits or behaviors that may be indicators of an eating disorder including binging or inducing vomiting?: No Has the patient recently lost weight without trying?: 2.0 Has the patient been eating poorly because of a decreased appetite?: 1 Malnutrition Screening Tool Score: 3    Physical Exam Vitals and nursing note reviewed.  Constitutional:      Appearance: Normal appearance.  Eyes:     General:        Right eye: No discharge.        Left eye: No discharge.  Cardiovascular:     Rate and Rhythm: Tachycardia present.  Pulmonary:     Effort: Pulmonary effort is normal. No respiratory distress.  Musculoskeletal:        General: Normal range of motion.     Cervical back: Normal range of motion.  Neurological:     Mental Status: He is alert and oriented to person, place, and time.  Psychiatric:        Attention and Perception: He perceives  auditory hallucinations.        Mood and Affect: Mood is anxious and depressed.        Speech: Speech normal.        Behavior: Behavior is withdrawn. Behavior is cooperative.        Thought Content: Thought content includes homicidal ideation. Thought content does not include homicidal plan.        Cognition and Memory: Cognition normal.        Judgment: Judgment is impulsive.    Review of Systems  Constitutional: Negative.   HENT: Negative.    Eyes: Negative.   Respiratory: Negative.    Cardiovascular: Negative.   Genitourinary: Negative.   Musculoskeletal: Negative.   Skin: Negative.   Neurological: Negative.   Psychiatric/Behavioral:  Positive for depression, hallucinations and substance abuse.     Blood pressure (!) 145/106, pulse (!) 109, temperature 98.7 F (37.1 C), temperature source Oral, resp. rate 18, SpO2 97%. There is no height or weight on file to calculate BMI.  Past Psychiatric History: Schizoaffective disorder bipolar type, polysubstance abuse (cocaine, alcohol, cannabis, and nicotine) MDD  Is the patient at risk to self? No  Has the  patient been a risk to self in the past 6 months? No .    Has the patient been a risk to self within the distant past? No   Is the patient a risk to others? Yes   Has the patient been a risk to others in the past 6 months? Yes   Has the patient been a risk to others within the distant past? Yes   Past Medical History:  Past Medical History:  Diagnosis Date   Bennett's fracture of base of metacarpal of right thumb 09/16/2014   Schizophrenia (HCC)      Family History: unkownn  Social History:  Single, has 1 child Employed Lives with his brother. Endorses alcohol use daily and marijuana Use   Last Labs:  Admission on 11/18/2022  Component Date Value Ref Range Status   WBC 11/18/2022 6.6  4.0 - 10.5 K/uL Final   RBC 11/18/2022 4.71  4.22 - 5.81 MIL/uL Final   Hemoglobin 11/18/2022 15.6  13.0 - 17.0 g/dL Final   HCT  82/95/6213 46.2  39.0 - 52.0 % Final   MCV 11/18/2022 98.1  80.0 - 100.0 fL Final   MCH 11/18/2022 33.1  26.0 - 34.0 pg Final   MCHC 11/18/2022 33.8  30.0 - 36.0 g/dL Final   RDW 08/65/7846 12.8  11.5 - 15.5 % Final   Platelets 11/18/2022 375  150 - 400 K/uL Final   nRBC 11/18/2022 0.0  0.0 - 0.2 % Final   Neutrophils Relative % 11/18/2022 69  % Final   Neutro Abs 11/18/2022 4.6  1.7 - 7.7 K/uL Final   Lymphocytes Relative 11/18/2022 21  % Final   Lymphs Abs 11/18/2022 1.4  0.7 - 4.0 K/uL Final   Monocytes Relative 11/18/2022 9  % Final   Monocytes Absolute 11/18/2022 0.6  0.1 - 1.0 K/uL Final   Eosinophils Relative 11/18/2022 0  % Final   Eosinophils Absolute 11/18/2022 0.0  0.0 - 0.5 K/uL Final   Basophils Relative 11/18/2022 0  % Final   Basophils Absolute 11/18/2022 0.0  0.0 - 0.1 K/uL Final   Immature Granulocytes 11/18/2022 1  % Final   Abs Immature Granulocytes 11/18/2022 0.03  0.00 - 0.07 K/uL Final   Performed at [facility], [address], [place], [place] [address]   Sodium 11/18/2022 138  135 - 145 mmol/L Final   Potassium 11/18/2022 3.9  3.5 - 5.1 mmol/L Final   Chloride 11/18/2022 100  98 - 111 mmol/L Final   CO2 11/18/2022 22  22 - 32 mmol/L Final   Glucose, Bld 11/18/2022 80  70 - 99 mg/dL Final   Glucose reference range applies only to samples taken after fasting for at least 8 hours.   BUN 11/18/2022 6  6 - 20 mg/dL Final   Creatinine, Ser 11/18/2022 0.86  0.61 - 1.24 mg/dL Final   Calcium 28/41/3244 9.4  8.9 - 10.3 mg/dL Final   Total Protein 04/17/7251 7.6  6.5 - 8.1 g/dL Final   Albumin 66/44/0347 4.4  3.5 - 5.0 g/dL Final   AST 42/59/5638 28  15 - 41 U/L Final   ALT 11/18/2022 21  0 - 44 U/L Final   Alkaline Phosphatase 11/18/2022 65  38 - 126 U/L Final   Total Bilirubin 11/18/2022 1.4 (H)  0.3 - 1.2 mg/dL Final   GFR, Estimated 11/18/2022 >60  >60 mL/min Final   Comment: (NOTE) Calculated using the CKD-EPI Creatinine Equation (2021)    Anion gap  11/18/2022  16 (H)  5 - 15 Final   Performed at [facility], [address], [place], [place] [address]   Hgb A1c MFr Bld 11/18/2022 4.7 (L)  4.8 - 5.6 % Final   Comment: (NOTE) Pre diabetes:          5.7%-6.4%  Diabetes:              >6.4%  Glycemic control for   <7.0% adults with diabetes    Mean Plasma Glucose 11/18/2022 88.19  mg/dL Final   Performed at [facility], [address], [place], [place] [address]   Magnesium 11/18/2022 1.6 (L)  1.7 - 2.4 mg/dL Final   Performed at [facility], [address], [place], [place] [address]   Alcohol, Ethyl (B) 11/18/2022 61 (H)  <10 mg/dL Final   Comment: (NOTE) Lowest detectable limit for serum alcohol is 10 mg/dL.  For medical purposes only. Performed at [facility], [address], [place], [place] [address]    TSH 11/18/2022 1.144  0.350 - 4.500 uIU/mL Final   Comment: Performed by a 3rd Generation assay with a functional sensitivity of <=0.01 uIU/mL. Performed at [facility], [address] [place], [place] [address]    POC Amphetamine UR 11/18/2022 None Detected  NONE DETECTED (Cut Off Level 1000 ng/mL) Final   POC Secobarbital (BAR) 11/18/2022 None Detected  NONE DETECTED (Cut Off Level 300 ng/mL) Final   POC Buprenorphine (BUP) 11/18/2022 None Detected  NONE DETECTED (Cut Off Level 10 ng/mL) Final   POC Oxazepam (BZO) 11/18/2022 None Detected  NONE DETECTED (Cut Off Level 300 ng/mL) Final   POC Cocaine UR 11/18/2022 Positive (A)  NONE DETECTED (Cut Off Level 300 ng/mL) Final   POC Methamphetamine UR 11/18/2022 Positive (A)  NONE DETECTED (Cut Off Level 1000 ng/mL) Final   POC Morphine 11/18/2022 None Detected  NONE DETECTED (Cut Off Level 300 ng/mL) Final   POC Methadone UR 11/18/2022 None Detected  NONE DETECTED (Cut Off Level 300 ng/mL) Final   POC Oxycodone UR 11/18/2022 None Detected  NONE DETECTED (Cut Off Level 100 ng/mL) Final   POC Marijuana UR 11/18/2022 Positive (A)  NONE DETECTED  (Cut Off Level 50 ng/mL) Final   Cholesterol 11/18/2022 216 (H)  0 - 200 mg/dL Final   Triglycerides 13/11/6576 80  <150 mg/dL Final   HDL 46/96/2952 66  >40 mg/dL Final   Total CHOL/HDL Ratio 11/18/2022 3.3  RATIO Final   VLDL 11/18/2022 16  0 - 40 mg/dL Final   LDL Cholesterol 11/18/2022 134 (H)  0 - 99 mg/dL Final   Comment:        Total Cholesterol/HDL:CHD Risk Coronary Heart Disease Risk Table                     Men   Women  1/2 Average Risk   3.4   3.3  Average Risk       5.0   4.4  2 X Average Risk   9.6   7.1  3 X Average Risk  23.4   11.0        Use the calculated Patient Ratio above and the CHD Risk Table to determine the patient's CHD Risk.        ATP III CLASSIFICATION (LDL):  <100     mg/dL   Optimal  841-324  mg/dL   Near or Above  Optimal  130-159  mg/dL   Borderline  951-884  mg/dL   High  >166     mg/dL   Very High Performed at [facility], [address], [place], [place] [address]     Allergies: Patient has no known allergies.  Medications:  Facility Ordered Medications  Medication   acetaminophen (TYLENOL) tablet 650 mg   alum & mag hydroxide-simeth (MAALOX/MYLANTA) 200-200-20 MG/5ML suspension 30 mL   magnesium hydroxide (MILK OF MAGNESIA) suspension 30 mL   hydrOXYzine (ATARAX) tablet 25 mg   traZODone (DESYREL) tablet 50 mg   OLANZapine (ZYPREXA) tablet 5 mg   thiamine (VITAMIN B1) injection 100 mg   [START ON 11/19/2022] thiamine (VITAMIN B1) tablet 100 mg   multivitamin with minerals tablet 1 tablet   LORazepam (ATIVAN) tablet 1 mg   hydrOXYzine (ATARAX) tablet 25 mg   loperamide (IMODIUM) capsule 2-4 mg   ondansetron (ZOFRAN-ODT) disintegrating tablet 4 mg   LORazepam (ATIVAN) tablet 1 mg   Followed by   Melene Muller ON 11/19/2022] LORazepam (ATIVAN) tablet 1 mg   Followed by   Melene Muller ON 11/20/2022] LORazepam (ATIVAN) tablet 1 mg   Followed by   Melene Muller ON 11/22/2022] LORazepam (ATIVAN) tablet 1 mg   PTA Medications   Medication Sig   OLANZapine (ZYPREXA) 10 MG tablet Take 1 tablet (10 mg total) by mouth at bedtime. (Patient not taking: Reported on 11/18/2022)      Medical Decision Making  Patient presents to [facility] with homicidal ideations.  He meets criteria for inpatient psychiatric admission.  He will be admitted to the continuous assessment unit while awaiting inpatient bed availability.    Recommendations  Based on my evaluation the patient does not appear to have an emergency medical condition.  Patient recommended for inpatient psychiatric admission.  He will be admitted to the continuous assessment unit while awaiting inpatient psychiatric bed availability.  Medications: Zyprexa 5 mg twice daily-schizoaffective bipolar type CIWA protocol with Ativan taper-alcohol use disorder  Lab Orders         CBC with Differential/Platelet         Comprehensive metabolic panel         Hemoglobin A1c         Magnesium         Ethanol         TSH         Lipid panel         POCT Urine Drug Screen - (I-Screen)      EKG  Ardis Hughs, NP 11/18/22  4:43 PM

## 2022-11-18 NOTE — ED Notes (Signed)
Patient admitted into obs unit. Patient A&Ox4 and independent with ADLS. Patient endorses auditory hallucinations and HI but not specific towards anyone. Patient denies SI and VH. Patient oriented to unit and provided with meal. Patient cooperative on unit and denies any pain or discomfort at this time.

## 2022-11-19 DIAGNOSIS — R45850 Homicidal ideations: Secondary | ICD-10-CM | POA: Diagnosis not present

## 2022-11-19 DIAGNOSIS — F250 Schizoaffective disorder, bipolar type: Secondary | ICD-10-CM | POA: Diagnosis not present

## 2022-11-19 NOTE — ED Notes (Signed)
Patient denies SI/HI and AVH. Patient has been calm and cooperative while on the unit. Patient was administered his medications. Patient will be monitored for safety.

## 2022-11-19 NOTE — ED Notes (Signed)
Pt sleeping at present, no distress noted.  Monitoring for safety. 

## 2022-11-20 DIAGNOSIS — R45850 Homicidal ideations: Secondary | ICD-10-CM | POA: Diagnosis not present

## 2022-11-20 DIAGNOSIS — F250 Schizoaffective disorder, bipolar type: Secondary | ICD-10-CM | POA: Diagnosis not present

## 2022-11-20 NOTE — ED Notes (Signed)
Pt sleeping@this time. Breathing even and unlabored. Will continue to monitor for safety 

## 2022-11-20 NOTE — ED Notes (Signed)
Pt A&O x 4, resting at present, no distress noted, calm & cooperative.  Monitoring for safety. 

## 2022-11-20 NOTE — ED Notes (Signed)
Pt sleeping at present, no distress noted.  Monitoring for safety. 

## 2022-11-20 NOTE — ED Notes (Signed)
Pt sleeping@this time. Breathing even and unlabored will continue to monitor for safety 

## 2022-11-21 DIAGNOSIS — F250 Schizoaffective disorder, bipolar type: Secondary | ICD-10-CM | POA: Diagnosis not present

## 2022-11-21 DIAGNOSIS — R45850 Homicidal ideations: Secondary | ICD-10-CM | POA: Diagnosis not present

## 2022-11-21 MED ORDER — OLANZAPINE 5 MG PO TABS: 5.0000 mg | ORAL_TABLET | Freq: Two times a day (BID) | ORAL | 0 refills | Status: AC

## 2022-11-21 MED ORDER — ADULT MULTIVITAMIN W/MINERALS CH: 1.0000 | ORAL_TABLET | Freq: Every day | ORAL | 0 refills | Status: AC

## 2022-11-21 MED ORDER — TRAZODONE HCL 50 MG PO TABS: 50.0000 mg | ORAL_TABLET | Freq: Every evening | ORAL | 0 refills | Status: AC | PRN

## 2022-11-21 NOTE — ED Provider Notes (Signed)
FBC/OBS ASAP Discharge Summary  Date and Time: 11/21/2022 8:45 AM  Name: Mike Thompson  MRN:  [id number]   Discharge Diagnoses:  Final diagnoses:  Schizoaffective disorder, bipolar type [facility])  Homicidal ideations    Subjective: Patient reports readiness to discharge home.  Patient states "when I came in I was just angry about life, my girl and me were fussing and I did not have very much money and it was my daughter's birthday on Tuesday."  Patient is reassessed face-to-face by this nurse practitioner.  He is reclined in observation area upon my approach, no apparent distress.  He is alert and oriented, pleasant and cooperative during assessment.  Mike Thompson presents with euthymic mood, congruent affect.  Patient states "I am ready to go now, I just needed to clear my mind."  Mike Thompson has been diagnosed with schizophrenia, schizoaffective disorder and major depressive disorder along with alcohol, cannabis and cocaine use disorders.  He is followed by Mike Thompson several years ago for outpatient psychiatry follow-up.  For several years he was lost to follow-up.  He would like to be followed by outpatient psychiatry moving forward.  He has uncertainty surrounding his healthcare insurance currently.  Reviewed potential follow-up options once he determines current coverage.  Patient is insightful and willing to seek outpatient follow-up, requests resources. He has been stable on olanzapine in the past.  Current medication regimen olanzapine 5 mg twice daily effective.  Patient reports average sleep and appetite well at [facility] behavioral health continuous observation unit. He denies family mental health or addiction history.  He endorses 1 previous inpatient psychiatric hospitalization.  Per chart review patient admitted to [facility] behavioral health in January 2023.  Chart reviewed and patient discussed with Dr. Daleen Thompson on 11/21/2022.  Patient denies suicidal and homicidal ideations.  He denies any  history of suicide attempts, denies any history of nonsuicidal self-harm.  He denies auditory and visual hallucinations currently.  There is no evidence of delusional thought content no indication that patient is responding to internal stimuli.  Gauge endorses alcohol use an average of 2 drinks an average of 5 days/week.  Most recent alcohol use 3 days ago.  Denies history of alcohol withdrawal seizure denies history of delirium tremens.  He denies substance use aside from alcohol.  Urine drug screen on 11/18/2022 positive for cocaine, methamphetamine and marijuana.  Patient declines substance use treatment options.  He denies substance use aside from alcohol.  Patient resides in [place] with his girlfriend and their 31-year-old daughter.  He denies access to weapons.  He is employed in [employer].  Patient offered support and encouragement.  He gives verbal consent to speak with his girlfriend of the home, Adriana Mccallum for number [phone]. Spoke with patient's girlfriend who denies safety concerns.  She confirms no knowledge of any access to weapons.  Patient and girlfriend verbalized understanding of strict return precautions.  Patient and family are educated and verbalize understanding of mental health resources and other crisis services in the community. They are instructed to call 911 and present to the nearest emergency room should patient experience any suicidal/homicidal ideation, auditory/visual/hallucinations, or detrimental worsening of mental health condition.     Stay Summary: HPI completed on 11/18/2022- 1331pm  patient presented to [facility] as a walk in unaccompanied with complaints of homicidal ideations   Mike Thompson, 37 y.o., male patient seen face to face by this provider, consulted with Dr. Lucianne Thompson; and chart reviewed on 11/18/22.  Per chart review patient has a  past psychiatric history of schizoaffective disorder bipolar type.  He has no outpatient services in  place.  He currently takes no medications.    Assessment note by Mike Thompson [facility] "Patient is a 37 year old male with a history of Schizoaffective Disorder, bipolar type (currently untreated) who presents voluntarily to [facility] for assessment.  Patient states that he is feeling homocidal towards others and has been for a few weeks.  He reports having "crazy thoughts about hurting people."  He reports AH, stating he hears male voices telling him to harm people.  He reports this is worsening and he came today to "get help.  It's why I'm here."  He admits he has been violent in the past, acting on what the voices have told him to do.  He has served time in jail for assault on "random people, police officers."   Patient does not identify anyone in particular he wants to harm and states that he could hurt "someone if he was to walk out the facility and got the wrong look."  He believes the trigger for this current episode was breaking up with his girlfriend two weeks ago.  He reports worsening symptoms during this two week period.  Patient has been admitted for inpatient treatment several times, most recently in 2022 at [facility].  He states he was taking zyprexa, which helped, for a while.  He discontinued medication in January 2024.  Patient admits to ETOH use, stating he drinks 2-4 (40's) daily for years, with last drink last night.  He also uses THC daily and cocaine on occasion.  Patient denies SI and VH.  Treatment options were discussed and patient is agreeable with recommendation for inpatient psychiatric treatment."    During evaluation Mike Thompson is observed sitting in the assessment room in no acute distress.  He is alert/oriented x 4, cooperative, and fairly attentive.  He is guarded and makes fair eye contact.  He has normal speech.  He endorses some depression.  He endorses feelings of guilt, irritability, decreased appetite and sleep.  He has a dysphoric affect.  He endorses  homicidal ideations  towards anyone that may look a specific way but he does not elaborate.  He is unable to contract for safety.  States, "that is why came here I do not want hurt anybody".  He denies any SI.  He denies visual hallucinations.  He endorses auditory hallucinations of hearing male voices that tell him to harm others.  He endorses  paranoia and feels that people are watching him and following him.  He does not appear to be responding to internal/external stimuli.   Discussed inpatient psychiatric admission and patient is in agreement.  Total Time spent with patient: 30 minutes  Past Psychiatric History: MDD, schizophrenia. Schizoaffective disorder, cannabis use disorder, alcohol use disorder, cocaine use disorder Past Medical History: history of closed fracture of base of first metacarpal bone of right hand Family History: none reported Family Psychiatric History: none reported Social History: resides with family, employed, alcohol use history Tobacco Cessation:  A prescription for an FDA-approved tobacco cessation medication was offered at discharge and the patient refused  Current Medications:  Current Facility-Administered Medications  Medication Dose Route Frequency Provider Last Rate Last Admin   acetaminophen (TYLENOL) tablet 650 mg  650 mg Oral Q6H PRN Ardis Hughs, NP       alum & mag hydroxide-simeth (MAALOX/MYLANTA) 200-200-20 MG/5ML suspension 30 mL  30 mL Oral Q4H PRN Vernard Gambles  H, NP       hydrOXYzine (ATARAX) tablet 25 mg  25 mg Oral TID PRN Ardis Hughs, NP       hydrOXYzine (ATARAX) tablet 25 mg  25 mg Oral Q6H PRN Ardis Hughs, NP   25 mg at 11/20/22 2156   loperamide (IMODIUM) capsule 2-4 mg  2-4 mg Oral PRN Ardis Hughs, NP       LORazepam (ATIVAN) tablet 1 mg  1 mg Oral Q6H PRN Ardis Hughs, NP       LORazepam (ATIVAN) tablet 1 mg  1 mg Oral BID Ardis Hughs, NP   1 mg at 11/20/22 2156   Followed by   Melene Muller ON  11/22/2022] LORazepam (ATIVAN) tablet 1 mg  1 mg Oral Daily Ardis Hughs, NP       magnesium hydroxide (MILK OF MAGNESIA) suspension 30 mL  30 mL Oral Daily PRN Ardis Hughs, NP       multivitamin with minerals tablet 1 tablet  1 tablet Oral Daily Ardis Hughs, NP   1 tablet at 11/20/22 1010   OLANZapine (ZYPREXA) tablet 5 mg  5 mg Oral BID Ardis Hughs, NP   5 mg at 11/20/22 2156   ondansetron (ZOFRAN-ODT) disintegrating tablet 4 mg  4 mg Oral Q6H PRN Ardis Hughs, NP       thiamine (VITAMIN B1) tablet 100 mg  100 mg Oral Daily Vernard Gambles H, NP   100 mg at 11/20/22 1010   traZODone (DESYREL) tablet 50 mg  50 mg Oral QHS PRN Ardis Hughs, NP   50 mg at 11/20/22 2156   Current Outpatient Medications  Medication Sig Dispense Refill   OLANZapine (ZYPREXA) 10 MG tablet Take 1 tablet (10 mg total) by mouth at bedtime. (Patient not taking: Reported on 11/18/2022) 30 tablet 0    PTA Medications:  Facility Ordered Medications  Medication   acetaminophen (TYLENOL) tablet 650 mg   alum & mag hydroxide-simeth (MAALOX/MYLANTA) 200-200-20 MG/5ML suspension 30 mL   magnesium hydroxide (MILK OF MAGNESIA) suspension 30 mL   hydrOXYzine (ATARAX) tablet 25 mg   traZODone (DESYREL) tablet 50 mg   OLANZapine (ZYPREXA) tablet 5 mg   [COMPLETED] thiamine (VITAMIN B1) injection 100 mg   thiamine (VITAMIN B1) tablet 100 mg   multivitamin with minerals tablet 1 tablet   LORazepam (ATIVAN) tablet 1 mg   hydrOXYzine (ATARAX) tablet 25 mg   loperamide (IMODIUM) capsule 2-4 mg   ondansetron (ZOFRAN-ODT) disintegrating tablet 4 mg   [EXPIRED] LORazepam (ATIVAN) tablet 1 mg   Followed by   [COMPLETED] LORazepam (ATIVAN) tablet 1 mg   Followed by   LORazepam (ATIVAN) tablet 1 mg   Followed by   Melene Muller ON 11/22/2022] LORazepam (ATIVAN) tablet 1 mg   PTA Medications  Medication Sig   OLANZapine (ZYPREXA) 10 MG tablet Take 1 tablet (10 mg total) by mouth at bedtime. (Patient  not taking: Reported on 11/18/2022)        No data to display          Flowsheet Row ED from 11/18/2022 in [facility]) [facility] from [facility]22/2021 in [facility]  C-SSRS RISK CATEGORY No Risk No Risk Error: Q6 is Yes, you must answer 7       Musculoskeletal  Strength & Muscle Tone: within normal limits Gait & Station: normal Patient leans: N/A  Psychiatric Specialty Exam  Presentation  General Appearance:  Appropriate for Environment; Casual  Eye Contact: Good  Speech: Clear and Coherent; Normal Rate  Speech Volume: Normal  Handedness: Right   Mood and Affect  Mood: Euthymic  Affect: Appropriate; Congruent   Thought Process  Thought Processes: Coherent; Goal Directed; Linear  Descriptions of Associations:Intact  Orientation:Full (Time, Place and Person)  Thought Content:Logical; WDL  Diagnosis of Schizophrenia or Schizoaffective disorder in past: Yes    Hallucinations:Hallucinations: None  Ideas of Reference:None  Suicidal Thoughts:Suicidal Thoughts: No  Homicidal Thoughts:Homicidal Thoughts: No   Sensorium  Memory: Immediate Good; Recent Good  Judgment: Good  Insight: Good   Executive Functions  Concentration: Good  Attention Span: Good  Recall: Good  Fund of Knowledge: Good  Language: Good   Psychomotor Activity  Psychomotor Activity: Psychomotor Activity: Normal   Assets  Assets: Communication Skills; Desire for Improvement; Financial Resources/Insurance; Housing; Physical Health; Resilience; Social Support   Sleep  Sleep: Sleep: Good   No data recorded  Physical Exam  Physical Exam Vitals and nursing note reviewed.  Constitutional:      Appearance: Normal appearance. He is well-developed.  HENT:     Head: Normocephalic and atraumatic.     Nose: Nose normal.   Cardiovascular:     Rate and Rhythm: Normal rate.  Pulmonary:     Effort: Pulmonary effort is normal.  Musculoskeletal:        General: Normal range of motion.  Skin:    General: Skin is warm and dry.  Neurological:     Mental Status: He is alert and oriented to person, place, and time.  Psychiatric:        Attention and Perception: Attention and perception normal.        Mood and Affect: Mood and affect normal.        Speech: Speech normal.        Behavior: Behavior normal. Behavior is cooperative.        Thought Content: Thought content normal.        Cognition and Memory: Cognition and memory normal.    Review of Systems  Constitutional: Negative.   HENT: Negative.    Eyes: Negative.   Respiratory: Negative.    Cardiovascular: Negative.   Gastrointestinal: Negative.   Genitourinary: Negative.   Musculoskeletal: Negative.   Skin: Negative.   Neurological: Negative.   Psychiatric/Behavioral: Negative.     Blood pressure 116/82, pulse 64, temperature 97.9 F (36.6 C), temperature source Oral, resp. rate 16, SpO2 100%. There is no height or weight on file to calculate BMI.  Demographic Factors:  Male  Loss Factors: Financial problems/change in socioeconomic status  Historical Factors: NA  Risk Reduction Factors:   Responsible for children under 83 years of age, Sense of responsibility to family, Employed, Living with another person, especially a relative, Positive social support, Positive therapeutic relationship, and Positive coping skills or problem solving skills  Continued Clinical Symptoms:  Previous Psychiatric Diagnoses and Treatments  Cognitive Features That Contribute To Risk:  None    Suicide Risk:  Minimal: No identifiable suicidal ideation.  Patients presenting with no risk factors but with morbid ruminations; may be classified as minimal risk based on the severity of the depressive symptoms  Plan Of Care/Follow-up recommendations:  Follow-up with  outpatient psychiatry, resources provided.  In addition follow-up is available this facility, [facility] behavioral health outpatient clinic on the second floor. Medications: -Multivitamin with minerals 1 tablet daily -Olanzapine 5 mg twice daily/mood -  trazodone 50 mg nightly as needed/sleep  Disposition: Discharge  Lenard Lance, FNP 11/21/2022, 8:45 AM

## 2022-11-21 NOTE — ED Notes (Signed)
Pt sleeping at present, no distress noted.  Monitoring for safety. 

## 2022-11-21 NOTE — ED Notes (Signed)
Patient was provided breakfast

## 2022-12-13 DIAGNOSIS — F1220 Cannabis dependence, uncomplicated: Secondary | ICD-10-CM | POA: Diagnosis not present

## 2022-12-13 DIAGNOSIS — Z91199 Patient's noncompliance with other medical treatment and regimen due to unspecified reason: Secondary | ICD-10-CM | POA: Diagnosis not present

## 2022-12-13 DIAGNOSIS — Z973 Presence of spectacles and contact lenses: Secondary | ICD-10-CM | POA: Diagnosis not present

## 2022-12-13 DIAGNOSIS — F325 Major depressive disorder, single episode, in full remission: Secondary | ICD-10-CM | POA: Diagnosis not present

## 2022-12-13 DIAGNOSIS — Z720 Tobacco use: Secondary | ICD-10-CM | POA: Diagnosis not present

## 2023-02-15 ENCOUNTER — Encounter (HOSPITAL_COMMUNITY): Payer: Self-pay

## 2023-02-15 ENCOUNTER — Ambulatory Visit (HOSPITAL_BASED_OUTPATIENT_CLINIC_OR_DEPARTMENT_OTHER): Payer: 59 | Admitting: Psychiatry

## 2023-02-15 DIAGNOSIS — Z91199 Patient's noncompliance with other medical treatment and regimen due to unspecified reason: Secondary | ICD-10-CM

## 2023-02-15 NOTE — Progress Notes (Signed)
Patient no showed today.  He is not on the video platform.  I called phone number [phone], someone pick up the phone but when I asked about the patient he hang up the phone.
